# Patient Record
Sex: Female | Born: 1965 | Race: White | Hispanic: No | Marital: Single | Smoking: Never smoker
Health system: Southern US, Community
[De-identification: ages and names within clinical notes are randomized; demographics above are authoritative.]

## PROBLEM LIST (undated history)

## (undated) DIAGNOSIS — N83209 Unspecified ovarian cyst, unspecified side: Secondary | ICD-10-CM

## (undated) DIAGNOSIS — R87629 Unspecified abnormal cytological findings in specimens from vagina: Secondary | ICD-10-CM

## (undated) DIAGNOSIS — Z8774 Personal history of (corrected) congenital malformations of heart and circulatory system: Secondary | ICD-10-CM

## (undated) DIAGNOSIS — O09529 Supervision of elderly multigravida, unspecified trimester: Secondary | ICD-10-CM

## (undated) DIAGNOSIS — R002 Palpitations: Secondary | ICD-10-CM

## (undated) HISTORY — DX: Unspecified ovarian cyst, unspecified side: N83.209

## (undated) HISTORY — DX: Palpitations: R00.2

## (undated) HISTORY — DX: Supervision of elderly multigravida, unspecified trimester: O09.529

## (undated) HISTORY — DX: Unspecified abnormal cytological findings in specimens from vagina: R87.629

## (undated) HISTORY — DX: Personal history of (corrected) congenital malformations of heart and circulatory system: Z87.74

## (undated) HISTORY — PX: COLPOSCOPY: SHX161

---
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
## 2004-03-15 HISTORY — PX: OTHER SURGICAL HISTORY: SHX169

## 2004-07-08 ENCOUNTER — Encounter: Admission: RE | Admit: 2004-07-08 | Discharge: 2004-07-08 | Payer: Self-pay | Admitting: *Deleted

## 2005-07-12 ENCOUNTER — Encounter: Admission: RE | Admit: 2005-07-12 | Discharge: 2005-07-12 | Payer: Self-pay | Admitting: *Deleted

## 2006-03-02 HISTORY — PX: US ECHOCARDIOGRAPHY: HXRAD669

## 2006-09-23 ENCOUNTER — Encounter: Admission: RE | Admit: 2006-09-23 | Discharge: 2006-09-23 | Payer: Self-pay | Admitting: *Deleted

## 2007-12-28 ENCOUNTER — Encounter: Admission: RE | Admit: 2007-12-28 | Discharge: 2007-12-28 | Payer: Self-pay | Admitting: Obstetrics and Gynecology

## 2008-03-15 HISTORY — PX: KNEE ARTHROSCOPY: SUR90

## 2008-10-15 ENCOUNTER — Ambulatory Visit: Payer: Self-pay | Admitting: Gastroenterology

## 2008-10-15 DIAGNOSIS — K644 Residual hemorrhoidal skin tags: Secondary | ICD-10-CM | POA: Insufficient documentation

## 2008-10-21 ENCOUNTER — Telehealth: Payer: Self-pay | Admitting: Gastroenterology

## 2009-02-05 ENCOUNTER — Encounter: Admission: RE | Admit: 2009-02-05 | Discharge: 2009-02-05 | Payer: Self-pay | Admitting: Obstetrics

## 2010-03-03 ENCOUNTER — Encounter
Admission: RE | Admit: 2010-03-03 | Discharge: 2010-03-03 | Payer: Self-pay | Source: Home / Self Care | Attending: Obstetrics | Admitting: Obstetrics

## 2010-04-06 ENCOUNTER — Encounter: Payer: Self-pay | Admitting: Obstetrics and Gynecology

## 2010-10-05 ENCOUNTER — Telehealth: Payer: Self-pay | Admitting: Cardiovascular Disease

## 2010-10-05 NOTE — Telephone Encounter (Signed)
PT HAS SOME QUESTIONS REGARDING HER PREVIOUS DIAGNOSIS SO SHE CAN RESEARCH IT SOME BEFORE HER NEXT VISIT WITH DR Elease Hashimoto. CHART IN BOX.

## 2010-10-05 NOTE — Telephone Encounter (Signed)
Patient called with old echo results. Has upcoming app and wanted to discuss bicuspid aortic valve, she forgot name of valve /questions answered.

## 2010-11-04 ENCOUNTER — Encounter: Payer: Self-pay | Admitting: Cardiovascular Disease

## 2010-11-13 ENCOUNTER — Ambulatory Visit (INDEPENDENT_AMBULATORY_CARE_PROVIDER_SITE_OTHER): Payer: BC Managed Care – PPO | Admitting: *Deleted

## 2010-11-13 DIAGNOSIS — Z8639 Personal history of other endocrine, nutritional and metabolic disease: Secondary | ICD-10-CM

## 2010-11-13 DIAGNOSIS — Z862 Personal history of diseases of the blood and blood-forming organs and certain disorders involving the immune mechanism: Secondary | ICD-10-CM

## 2010-11-13 DIAGNOSIS — I359 Nonrheumatic aortic valve disorder, unspecified: Secondary | ICD-10-CM

## 2010-11-13 DIAGNOSIS — I10 Essential (primary) hypertension: Secondary | ICD-10-CM

## 2010-11-13 LAB — LIPID PANEL
Cholesterol: 159 mg/dL (ref 0–200)
HDL: 90.8 mg/dL (ref >39.00)
LDL Cholesterol: 63 mg/dL (ref 0–99)
Total CHOL/HDL Ratio: 2
Triglycerides: 27 mg/dL (ref 0.0–149.0)
VLDL: 5.4 mg/dL (ref 0.0–40.0)

## 2010-11-13 LAB — BASIC METABOLIC PANEL
BUN: 24 mg/dL — ABNORMAL HIGH (ref 6–23)
Calcium: 9.2 mg/dL (ref 8.4–10.5)
Creatinine, Ser: 0.8 mg/dL (ref 0.4–1.2)
GFR: 77.5 mL/min (ref >60.00)

## 2010-11-13 LAB — HEPATIC FUNCTION PANEL
ALT: 33 U/L (ref 0–35)
Bilirubin, Direct: 0.1 mg/dL (ref 0.0–0.3)
Total Bilirubin: 0.6 mg/dL (ref 0.3–1.2)

## 2010-11-18 ENCOUNTER — Encounter: Payer: Self-pay | Admitting: Cardiovascular Disease

## 2010-11-18 ENCOUNTER — Ambulatory Visit (INDEPENDENT_AMBULATORY_CARE_PROVIDER_SITE_OTHER): Payer: BC Managed Care – PPO | Admitting: Cardiovascular Disease

## 2010-11-18 VITALS — BP 104/76 | HR 55 | Ht 63.0 in | Wt 126.2 lb

## 2010-11-18 DIAGNOSIS — Q231 Congenital insufficiency of aortic valve: Secondary | ICD-10-CM | POA: Insufficient documentation

## 2010-11-18 NOTE — Assessment & Plan Note (Addendum)
Her valve sounds stable.  We will get a repeat echo since it's been 5 years since her previous  Echo.  I was  able to palpate her abdominal aorta but there is no indication that she has a dissection. LC her again in one year for followup visit.

## 2010-11-18 NOTE — Progress Notes (Signed)
Jessica [name] Date of Birth  1966-02-27 [facility] / [facility] [address]     [address] [place], [place]  [address] [phone]  Fax  [phone]  History of Present Illness:  This is a 45 year old female with a history of a bicuspid aortic valve. She's had some intermittent chest pains in the past.  She's had a few palpitations but nothing very serious. She's exercising on regular basis and works out 5 times a week. She's been really trying to cut down on her fat intake and carbohydrate intake.  Current Outpatient Prescriptions on File Prior to Visit  Medication Sig Dispense Refill  . fish oil-omega-3 fatty acids 1000 MG capsule Take 1 g by mouth daily.        . Multiple Vitamin (MULTIVITAMIN) tablet Take by mouth daily. Women Ultra Joints      . Multiple Vitamins-Calcium (VIACTIV MULTI-VITAMIN) CHEW Chew by mouth daily.        . propranolol (INDERAL) 10 MG tablet Take 10 mg by mouth as needed.          Allergies  Allergen Reactions  . Penicillins     Past Medical History  Diagnosis Date  . Chest pain   . Palpitations   . History of bicuspid aortic valve     Past Surgical History  Procedure Date  . US echocardiography 03/02/2006    EF 55-60%    History  Smoking status  . Never Smoker   Smokeless tobacco  . Not on file    History  Alcohol Use No    No family history on file.  Reviw of Systems:  Reviewed in the HPI.  All other systems are negative.  Physical Exam: BP 104/76  Pulse 55  Ht 5\' 3"  (1.6 m)  Wt 126 lb 3.2 oz (57.244 kg)  BMI 22.36 kg/m2 The patient is alert and oriented x 3.  The mood and affect are normal.   Skin: warm and dry.  Color is normal.    HEENT:   the sclera are nonicteric.  The mucous membranes are moist.  The carotids are 2+ without bruits.  There is no thyromegaly.  There is no JVD.    Lungs: clear.  The chest wall is non tender.    Heart: regular rate with a normal S1 and S2.  There is a very  soft murmur. The PMI is not displaced.     Abdomen: good bowel sounds.  There is no guarding or rebound.  There is no hepatosplenomegaly or tenderness.  I was able to palpate her abdominal aorta.  Extremities:  no clubbing, cyanosis, or edema.  The legs are without rashes.  The distal pulses are intact.   Neuro:  Cranial nerves II - XII are intact.  Motor and sensory functions are intact.    The gait is normal.  ECG:  Assessment / Plan:

## 2010-11-19 ENCOUNTER — Encounter: Payer: Self-pay | Admitting: Cardiovascular Disease

## 2010-11-19 ENCOUNTER — Ambulatory Visit (HOSPITAL_COMMUNITY): Payer: BC Managed Care – PPO | Attending: Cardiovascular Disease

## 2010-11-19 DIAGNOSIS — Q231 Congenital insufficiency of aortic valve: Secondary | ICD-10-CM

## 2010-11-19 DIAGNOSIS — R079 Chest pain, unspecified: Secondary | ICD-10-CM | POA: Insufficient documentation

## 2010-11-19 DIAGNOSIS — R002 Palpitations: Secondary | ICD-10-CM | POA: Insufficient documentation

## 2010-11-19 DIAGNOSIS — I079 Rheumatic tricuspid valve disease, unspecified: Secondary | ICD-10-CM | POA: Insufficient documentation

## 2010-11-19 DIAGNOSIS — I080 Rheumatic disorders of both mitral and aortic valves: Secondary | ICD-10-CM | POA: Insufficient documentation

## 2010-11-23 ENCOUNTER — Telehealth: Payer: Self-pay | Admitting: Cardiology

## 2010-11-23 NOTE — Telephone Encounter (Signed)
Message copied by Karle Plumber on Mon Nov 23, 2010  5:13 PM ------      Message from: [place], [place]      Created: Fri Nov 20, 2010  5:12 PM       The aortic valve looks the same.  Will plan on repeating the echo in ~5 years.

## 2010-11-24 ENCOUNTER — Telehealth: Payer: Self-pay | Admitting: Cardiovascular Disease

## 2010-11-24 NOTE — Telephone Encounter (Signed)
No change in echo, repeat in 5 years,Pt verbalized understanding. Alfonso Ramus RN

## 2010-11-24 NOTE — Telephone Encounter (Signed)
Pt returning call to Amy from yesterday. Please return call to discuss.

## 2010-12-09 ENCOUNTER — Telehealth: Payer: Self-pay | Admitting: Cardiovascular Disease

## 2010-12-09 NOTE — Telephone Encounter (Signed)
Received request for med rec info from [facility].  Faxed last OV Note, EKG, Echo, and Labs.  No stress tests were available.  Faxed info today.

## 2010-12-09 NOTE — Telephone Encounter (Signed)
Received confirmation that fax went through.

## 2010-12-10 ENCOUNTER — Telehealth: Payer: Self-pay | Admitting: Cardiovascular Disease

## 2010-12-10 NOTE — Telephone Encounter (Signed)
Echo faxed to [facility] @ [phone]  12/10/10/km

## 2010-12-14 HISTORY — PX: OTHER SURGICAL HISTORY: SHX169

## 2011-02-01 ENCOUNTER — Telehealth: Payer: Self-pay | Admitting: Cardiovascular Disease

## 2011-02-01 NOTE — Telephone Encounter (Signed)
Pt calling re status of fax re an adoption form she needed completed

## 2011-02-01 NOTE — Telephone Encounter (Signed)
Already mailed on 01/21/11 pt informed

## 2011-02-08 ENCOUNTER — Telehealth: Payer: Self-pay | Admitting: Gastroenterology

## 2011-02-08 NOTE — Telephone Encounter (Signed)
Pt saw Dr Christella Hartigan on 10/15/2008 for a painful anal bump w/o bleeding, no constipation altho she sometimes strains for a BM. Pt was given Analpram Cream and instructed to do Sitz baths. She has a family hx of Colon Cancer and was instructed to set up a COLON. Pt called back a week later and stated she didn't think she could do the prep d/t the hemorrhoid. She never called back to schedule the COLON. Today, pt reports no problems and wants to know if she should still have the COLON done. Advised pt she needs the COLON d/t her family hx of COLON CANCER. Dr Christella Hartigan, does pt need an OV or can she be scheduled for DIRECT COLON? Thanks.

## 2011-02-09 ENCOUNTER — Other Ambulatory Visit: Payer: Self-pay | Admitting: Gastroenterology

## 2011-02-09 DIAGNOSIS — Z800 Family history of malignant neoplasm of digestive organs: Secondary | ICD-10-CM

## 2011-02-09 NOTE — Telephone Encounter (Signed)
lmom for pt to call back

## 2011-02-09 NOTE — Telephone Encounter (Signed)
Can have direct colonsocopy

## 2011-02-09 NOTE — Telephone Encounter (Signed)
Pt scheduled for PV on 02/22/11 and DIRECT COLON on 02/26/11.

## 2011-02-12 ENCOUNTER — Telehealth: Payer: Self-pay | Admitting: Gastroenterology

## 2011-02-12 NOTE — Telephone Encounter (Signed)
Pt had question regarding how soon she should be put under anesthesia after having knee surgery in October.  Pt was advised to keep previsit appt and the nurse would take down her history and discuss with her at that time.  Pt agreed

## 2011-02-19 ENCOUNTER — Telehealth: Payer: Self-pay | Admitting: Gastroenterology

## 2011-02-19 NOTE — Telephone Encounter (Signed)
Dr Christella Hartigan the compression stocking should not cause a problem should they?

## 2011-02-19 NOTE — Telephone Encounter (Signed)
Pt aware.

## 2011-02-19 NOTE — Telephone Encounter (Signed)
    That should not be a problem at all.

## 2011-02-22 ENCOUNTER — Ambulatory Visit (AMBULATORY_SURGERY_CENTER): Payer: BC Managed Care – PPO | Admitting: *Deleted

## 2011-02-22 ENCOUNTER — Encounter: Payer: Self-pay | Admitting: Gastroenterology

## 2011-02-22 VITALS — Ht 63.0 in | Wt 129.3 lb

## 2011-02-22 DIAGNOSIS — Z1211 Encounter for screening for malignant neoplasm of colon: Secondary | ICD-10-CM

## 2011-02-22 DIAGNOSIS — Z800 Family history of malignant neoplasm of digestive organs: Secondary | ICD-10-CM

## 2011-02-24 ENCOUNTER — Ambulatory Visit (INDEPENDENT_AMBULATORY_CARE_PROVIDER_SITE_OTHER): Payer: Self-pay | Admitting: *Deleted

## 2011-02-24 DIAGNOSIS — I781 Nevus, non-neoplastic: Secondary | ICD-10-CM

## 2011-02-24 DIAGNOSIS — I83893 Varicose veins of bilateral lower extremities with other complications: Secondary | ICD-10-CM

## 2011-02-24 NOTE — Progress Notes (Signed)
Addended by: Clementeen Hoof on: 02/24/2011 02:47 PM   Modules accepted: Orders

## 2011-02-24 NOTE — Progress Notes (Signed)
X=.3% Sotradecol administered with a 27g butterfly.  Patient received a total of 6cc foam.  Cutaneous Laser:pulsed mode  2.8 watts 30 secs. .3 pulses   Total pulses: 239 Total energy 674  Total time:07  Photos: no  Compression stockings applied: yes  Treated all areas of concern- tiny spiders mainly. A few retics on the backs of her kness. Hit a few of the really tiny spiders to see how she reacted to CL ([place]). Did a few on her back and chest too.

## 2011-02-25 ENCOUNTER — Other Ambulatory Visit: Payer: Self-pay | Admitting: Obstetrics

## 2011-02-25 ENCOUNTER — Encounter: Payer: Self-pay | Admitting: Vascular Surgery

## 2011-02-25 DIAGNOSIS — Z1231 Encounter for screening mammogram for malignant neoplasm of breast: Secondary | ICD-10-CM

## 2011-02-26 ENCOUNTER — Ambulatory Visit (AMBULATORY_SURGERY_CENTER): Payer: BC Managed Care – PPO | Admitting: Gastroenterology

## 2011-02-26 ENCOUNTER — Encounter: Payer: Self-pay | Admitting: Gastroenterology

## 2011-02-26 DIAGNOSIS — Z1211 Encounter for screening for malignant neoplasm of colon: Secondary | ICD-10-CM

## 2011-02-26 DIAGNOSIS — K5730 Diverticulosis of large intestine without perforation or abscess without bleeding: Secondary | ICD-10-CM

## 2011-02-26 DIAGNOSIS — Z800 Family history of malignant neoplasm of digestive organs: Secondary | ICD-10-CM

## 2011-02-26 MED ORDER — SODIUM CHLORIDE 0.9 % IV SOLN: 500.0000 mL | INTRAVENOUS | Status: DC

## 2011-02-26 NOTE — Progress Notes (Signed)
Patient did not experience any of the following events: a burn prior to discharge; a fall within the facility; wrong site/side/patient/procedure/implant event; or a hospital transfer or hospital admission upon discharge from the facility. (G8907) Patient did not have preoperative order for IV antibiotic SSI prophylaxis. (G8918)  

## 2011-02-26 NOTE — Op Note (Signed)
[facility] [address] [place], [place]  [address]  COLONOSCOPY PROCEDURE REPORT  PATIENT:  Jessica, [name]  MR#:  [id number] BIRTHDATE:  Apr 10, 1965, 45 yrs. old  GENDER:  female ENDOSCOPIST:  Rachael Fee, MD PROCEDURE DATE:  02/26/2011 PROCEDURE:  Colonoscopy 29562 ASA CLASS:  Class II INDICATIONS:  family history of colon cancer MEDICATIONS:   Fentanyl 50 mcg IV, These medications were titrated to patient response per physician's verbal order, Versed 7 mg IV  DESCRIPTION OF PROCEDURE:   After the risks benefits and alternatives of the procedure were thoroughly explained, informed consent was obtained.  Digital rectal exam was performed and revealed no rectal masses.   The LB [id number] endoscope was introduced through the anus and advanced to the cecum, which was identified by both the appendix and ileocecal valve, without limitations.  The quality of the prep was good..  The instrument was then slowly withdrawn as the colon was fully examined. <<PROCEDUREIMAGES>> FINDINGS:  Mild diverticulosis was found in the sigmoid to descending colon segments (see image3).  This was otherwise a normal examination of the colon (see image1, image2, and image4). Retroflexed views in the rectum revealed no abnormalities. COMPLICATIONS:  None  ENDOSCOPIC IMPRESSION: 1) Mild diverticulosis in the sigmoid to descending colon segments 2) Otherwise normal examination; no polyps or cancers  RECOMMENDATIONS: 1) You should continue to follow colorectal cancer screening guidelines for "routine risk" patients with a repeat colonoscopy in 10 years. There is no need for FOBT (stool) testing for at least 5 years.  REPEAT EXAM:  10 years  ______________________________ Rachael Fee, MD  n. eSIGNED:   Rachael Fee at 02/26/2011 01:59 PM  Pfenning, Misty Stanley, [phone]

## 2011-02-26 NOTE — Patient Instructions (Signed)
Please follow all discharge instructions given to you by the recovery room nurse. If you have any questions or problems after discharge please call [phone]. You will receive a phone call in the am to see how you are doing and answer any questions you may have. Thank you for choosing [facility] for your health care needs.  

## 2011-02-26 NOTE — Progress Notes (Signed)
Pt. States she has "belly ring" that she can't remove.  Has never removed it.

## 2011-03-01 ENCOUNTER — Telehealth: Payer: Self-pay | Admitting: *Deleted

## 2011-03-01 NOTE — Telephone Encounter (Signed)

## 2011-03-05 ENCOUNTER — Ambulatory Visit
Admission: RE | Admit: 2011-03-05 | Discharge: 2011-03-05 | Disposition: A | Discharge status: Home / Self Care | Payer: BC Managed Care – PPO | Source: Ambulatory Visit | Attending: Obstetrics | Admitting: Obstetrics

## 2011-03-05 DIAGNOSIS — Z1231 Encounter for screening mammogram for malignant neoplasm of breast: Secondary | ICD-10-CM

## 2011-04-07 ENCOUNTER — Ambulatory Visit: Payer: BC Managed Care – PPO | Admitting: *Deleted

## 2011-06-23 ENCOUNTER — Ambulatory Visit: Payer: BC Managed Care – PPO | Admitting: *Deleted

## 2011-09-03 ENCOUNTER — Telehealth: Payer: Self-pay

## 2011-09-03 NOTE — Telephone Encounter (Signed)
Phone call from pt.  C/o a throbbing in left side of (L) leg.  Describes an approx. 3 inch "dark bluish/brown area" that with increased pressure, ie: bending, stretching, notices a "sharp, shooting pain" that comes and goes.  States the vein feels "soft", not hard.  Denies any swelling of left leg, or redness/warmth at site.  States has not been wearing compression stockings since her sclerotherapy.  Advised to wear her compression stockings, elevate leg at intervals, and take ibuprofen per package directions.  Advised will have the vein nurse call her on 6/24.

## 2011-11-08 ENCOUNTER — Telehealth: Payer: Self-pay | Admitting: Cardiovascular Disease

## 2011-11-08 NOTE — Telephone Encounter (Signed)
Pt requesting blood work prior to appt, also wants to do toxoplasmosis blood test, pls call [phone]

## 2011-11-08 NOTE — Telephone Encounter (Signed)
lmtcb

## 2011-11-08 NOTE — Telephone Encounter (Signed)
Informed her to have her OB Gyn write the order due to her pregnancy, pt agreed to plan.

## 2011-12-21 ENCOUNTER — Telehealth: Payer: Self-pay | Admitting: Cardiovascular Disease

## 2011-12-21 DIAGNOSIS — E785 Hyperlipidemia, unspecified: Secondary | ICD-10-CM

## 2011-12-21 NOTE — Telephone Encounter (Signed)
Called pt on mobile, lab date set, orders placed.

## 2011-12-21 NOTE — Telephone Encounter (Signed)
MSG LEFT, I WILL TRY TO CALL BACK IN 15 MINUTES

## 2011-12-21 NOTE — Telephone Encounter (Signed)
LMTCB

## 2011-12-21 NOTE — Telephone Encounter (Signed)
plz return call to patient regarding lab orders for upcoming appnt w/Nahser

## 2011-12-27 ENCOUNTER — Other Ambulatory Visit (INDEPENDENT_AMBULATORY_CARE_PROVIDER_SITE_OTHER): Payer: BC Managed Care – PPO

## 2011-12-27 DIAGNOSIS — I781 Nevus, non-neoplastic: Secondary | ICD-10-CM

## 2011-12-27 DIAGNOSIS — E785 Hyperlipidemia, unspecified: Secondary | ICD-10-CM

## 2011-12-27 LAB — HEPATIC FUNCTION PANEL
ALT: 19 U/L (ref 0–35)
AST: 26 U/L (ref 0–37)
Albumin: 3.6 g/dL (ref 3.5–5.2)
Alkaline Phosphatase: 42 U/L (ref 39–117)
Total Protein: 6.7 g/dL (ref 6.0–8.3)

## 2011-12-27 LAB — BASIC METABOLIC PANEL
BUN: 23 mg/dL (ref 6–23)
Calcium: 8.9 mg/dL (ref 8.4–10.5)
GFR: 86.57 mL/min (ref >60.00)
Glucose, Bld: 78 mg/dL (ref 70–99)
Sodium: 141 mEq/L (ref 135–145)

## 2011-12-27 LAB — LIPID PANEL: Cholesterol: 137 mg/dL (ref 0–200)

## 2011-12-28 ENCOUNTER — Ambulatory Visit (INDEPENDENT_AMBULATORY_CARE_PROVIDER_SITE_OTHER): Payer: BC Managed Care – PPO | Admitting: Cardiovascular Disease

## 2011-12-28 ENCOUNTER — Encounter: Payer: Self-pay | Admitting: Cardiovascular Disease

## 2011-12-28 VITALS — BP 136/80 | HR 56 | Ht 63.0 in | Wt 129.1 lb

## 2011-12-28 DIAGNOSIS — Q231 Congenital insufficiency of aortic valve: Secondary | ICD-10-CM

## 2011-12-28 NOTE — Assessment & Plan Note (Signed)
Jessica [name] seems to be stable. She has a bicuspid aortic valve. She'll has mild aortic stenosis.  She informed me today that she would like to try to become pregnant. At this point her aortic valve is not severely stenotic and I don't think that she would have any complications related to her bicuspid aortic valve. She is 46 years old and that would involve some extra degree of complications that she'll need to talk to her obstetrician.  I will see her again in one year for followup visit.

## 2011-12-28 NOTE — Progress Notes (Signed)
Jessica [name] Date of Birth  Jul 26, 1965 [facility] / [facility] [address]     [address] [place], [place]  [address] [phone]  Fax  [phone]  History of Present Illness:  This is a 46 year old female with a history of a bicuspid aortic valve. She's had some intermittent chest pains in the past.  She's had a few palpitations but nothing very serious. She's exercising on regular basis and works out 5 times a week. She's been really trying to cut down on her fat intake and carbohydrate intake.    Current Outpatient Prescriptions on File Prior to Visit  Medication Sig Dispense Refill  . Multiple Vitamins-Calcium (VIACTIV MULTI-VITAMIN) CHEW Chew by mouth daily.        . Probiotic Product (PROBIOTIC PO) Take by mouth. Digestive Enzyme         Allergies  Allergen Reactions  . Penicillins Hives    Past Medical History  Diagnosis Date  . Chest pain   . Palpitations   . History of bicuspid aortic valve     Past Surgical History  Procedure Date  . US echocardiography 03/02/2006    EF 55-60%  . Knee arthrscopy October 2012    left  . Knee arthroscopy 2010    right  . Strabismus surgery 1972    bothe eyes  . Tonsillectomy and adenoidectomy 1974  . Bilateral foot surgery 2006    History  Smoking status  . Never Smoker   Smokeless tobacco  . Never Used    History  Alcohol Use  . 1.8 oz/week  . 3 Glasses of wine per week    Family History  Problem Relation Age of Onset  . Colon cancer Paternal Uncle     great uncle    Reviw of Systems:  Reviewed in the HPI.  All other systems are negative.  Physical Exam: BP 136/80  Pulse 56  Ht 5\' 3"  (1.6 m)  Wt 129 lb 1.9 oz (58.568 kg)  BMI 22.87 kg/m2 The patient is alert and oriented x 3.  The mood and affect are normal.   Skin: warm and dry.  Color is normal.    HEENT:   the sclera are nonicteric.  The mucous membranes are moist.  The carotids are 2+ without bruits.  There is no  thyromegaly.  There is no JVD.    Lungs: clear.  The chest wall is non tender.    Heart: regular rate with a normal S1 and S2.  There is a very soft murmur. The PMI is not displaced.     Abdomen: good bowel sounds.  There is no guarding or rebound.  There is no hepatosplenomegaly or tenderness.  I was able to palpate her abdominal aorta.  Extremities:  no clubbing, cyanosis, or edema.  The legs are without rashes.  The distal pulses are intact.   Neuro:  Cranial nerves II - XII are intact.  Motor and sensory functions are intact.    The gait is normal.  ECG: 12/28/2011-bradycardia 56 beats a minute. She has no ST or T wave changes. Assessment / Plan:

## 2011-12-28 NOTE — Patient Instructions (Addendum)
I recommend that you work out on a regular basis.  Your physician wants you to follow-up in: 1 year  You will receive a reminder letter in the mail two months in advance. If you don't receive a letter, please call our office to schedule the follow-up appointment.  Your physician recommends that you continue on your current medications as directed. Please refer to the Current Medication list given to you today.

## 2012-01-13 ENCOUNTER — Encounter: Payer: Self-pay | Admitting: Cardiovascular Disease

## 2012-01-26 ENCOUNTER — Ambulatory Visit: Payer: BC Managed Care – PPO | Admitting: *Deleted

## 2012-01-27 ENCOUNTER — Encounter: Payer: Self-pay | Admitting: *Deleted

## 2012-01-28 ENCOUNTER — Ambulatory Visit: Payer: BC Managed Care – PPO | Admitting: *Deleted

## 2012-02-02 ENCOUNTER — Ambulatory Visit: Payer: BC Managed Care – PPO | Admitting: *Deleted

## 2012-03-01 ENCOUNTER — Ambulatory Visit: Payer: BC Managed Care – PPO | Admitting: *Deleted

## 2012-03-22 ENCOUNTER — Other Ambulatory Visit: Payer: Self-pay | Admitting: Obstetrics

## 2012-03-22 DIAGNOSIS — Z1231 Encounter for screening mammogram for malignant neoplasm of breast: Secondary | ICD-10-CM

## 2012-04-03 ENCOUNTER — Ambulatory Visit
Admission: RE | Admit: 2012-04-03 | Discharge: 2012-04-03 | Disposition: A | Discharge status: Home / Self Care | Payer: BC Managed Care – PPO | Source: Ambulatory Visit | Attending: Obstetrics | Admitting: Obstetrics

## 2012-04-03 DIAGNOSIS — Z1231 Encounter for screening mammogram for malignant neoplasm of breast: Secondary | ICD-10-CM

## 2012-11-14 ENCOUNTER — Encounter: Payer: Self-pay | Admitting: *Deleted

## 2012-11-15 ENCOUNTER — Ambulatory Visit (INDEPENDENT_AMBULATORY_CARE_PROVIDER_SITE_OTHER): Payer: Self-pay | Admitting: *Deleted

## 2012-11-15 ENCOUNTER — Encounter: Payer: Self-pay | Admitting: Vascular Surgery

## 2012-11-15 DIAGNOSIS — I781 Nevus, non-neoplastic: Secondary | ICD-10-CM

## 2012-11-15 NOTE — Progress Notes (Signed)
Treated all areas of concern with a combo of sclero and cutaneous laser. She really didn't have many areas. Tolerated well. Will follow prn. .3% sotradecol administered with a 27G butterfly for a total of 5cc. Thigh high compression stockings applied.

## 2013-01-17 ENCOUNTER — Telehealth: Payer: Self-pay | Admitting: Cardiovascular Disease

## 2013-01-17 DIAGNOSIS — Z13220 Encounter for screening for lipoid disorders: Secondary | ICD-10-CM

## 2013-01-17 NOTE — Telephone Encounter (Signed)
New message    Seeing Dr Elease Hashimoto on 03-05-13.  She want to have fasting lab work while she is here.  Will this be ok?

## 2013-01-17 NOTE — Telephone Encounter (Signed)
Labs scheduled, advised patient ok to get

## 2013-02-21 ENCOUNTER — Telehealth: Payer: Self-pay | Admitting: Cardiovascular Disease

## 2013-02-21 NOTE — Telephone Encounter (Signed)
New problem:  Pt states on 12/18 she is having an embryo transplant and pt wants to know if it will cause any complications for her to come to her appt on the 22nd. Pt is asking if anything done at the appt would disrupt the transplant. Pt is requesting a call back.

## 2013-02-21 NOTE — Telephone Encounter (Signed)
Pt was rescheduled to avoid any possible problem with embryo transplant. Pt accepting of plan.

## 2013-02-27 ENCOUNTER — Other Ambulatory Visit: Payer: Self-pay

## 2013-02-27 DIAGNOSIS — Z1231 Encounter for screening mammogram for malignant neoplasm of breast: Secondary | ICD-10-CM

## 2013-03-05 ENCOUNTER — Ambulatory Visit: Payer: BC Managed Care – PPO | Admitting: Cardiovascular Disease

## 2013-03-05 ENCOUNTER — Other Ambulatory Visit: Payer: BC Managed Care – PPO

## 2013-04-03 ENCOUNTER — Encounter: Payer: Self-pay | Admitting: Cardiovascular Disease

## 2013-04-03 ENCOUNTER — Ambulatory Visit (INDEPENDENT_AMBULATORY_CARE_PROVIDER_SITE_OTHER): Payer: BC Managed Care – PPO | Admitting: Cardiovascular Disease

## 2013-04-03 ENCOUNTER — Ambulatory Visit (INDEPENDENT_AMBULATORY_CARE_PROVIDER_SITE_OTHER): Payer: BC Managed Care – PPO | Admitting: *Deleted

## 2013-04-03 VITALS — BP 118/70 | HR 64 | Ht 63.0 in | Wt 131.0 lb

## 2013-04-03 DIAGNOSIS — Q231 Congenital insufficiency of aortic valve: Secondary | ICD-10-CM

## 2013-04-03 DIAGNOSIS — I781 Nevus, non-neoplastic: Secondary | ICD-10-CM

## 2013-04-03 LAB — HEPATIC FUNCTION PANEL
ALT: 17 U/L (ref 0–35)
AST: 24 U/L (ref 0–37)
Albumin: 3.3 g/dL — ABNORMAL LOW (ref 3.5–5.2)
Alkaline Phosphatase: 35 U/L — ABNORMAL LOW (ref 39–117)
BILIRUBIN DIRECT: 0.1 mg/dL (ref 0.0–0.3)
BILIRUBIN TOTAL: 0.5 mg/dL (ref 0.3–1.2)
Total Protein: 6.5 g/dL (ref 6.0–8.3)

## 2013-04-03 LAB — LIPID PANEL
CHOL/HDL RATIO: 2
Cholesterol: 152 mg/dL (ref 0–200)
HDL: 72.4 mg/dL (ref >39.00)
LDL Cholesterol: 64 mg/dL (ref 0–99)
Triglycerides: 79 mg/dL (ref 0.0–149.0)
VLDL: 15.8 mg/dL (ref 0.0–40.0)

## 2013-04-03 LAB — BASIC METABOLIC PANEL
BUN: 17 mg/dL (ref 6–23)
CO2: 24 meq/L (ref 19–32)
Calcium: 8.7 mg/dL (ref 8.4–10.5)
Chloride: 106 mEq/L (ref 96–112)
Creatinine, Ser: 0.9 mg/dL (ref 0.4–1.2)
GFR: 70.84 mL/min (ref >60.00)
GLUCOSE: 77 mg/dL (ref 70–99)
Potassium: 3.8 mEq/L (ref 3.5–5.1)
Sodium: 136 mEq/L (ref 135–145)

## 2013-04-03 NOTE — Patient Instructions (Signed)
Your physician wants you to follow-up in: 1 year  You will receive a reminder letter in the mail two months in advance. If you don't receive a letter, please call our office to schedule the follow-up appointment.  Your physician recommends that you continue on your current medications as directed. Please refer to the Current Medication list given to you today.  

## 2013-04-03 NOTE — Assessment & Plan Note (Signed)
Jessica [name] is doing well.  She has a bicuspid aortic valve with minimal AS and AI.    I would anticipate that she would have no complications during her pregnancy.    I will see her back in 1 year.

## 2013-04-03 NOTE — Progress Notes (Signed)
Jessica [name] Date of Birth  04-09-1965 [facility] [address]     [address] [place], [place]  [address] [phone]  Fax  [phone]   Problem list: 1. Bicuspid aortic valve 2. Palpitations   History of Present Illness:  This is a 48 year old female with a history of a bicuspid aortic valve. She's had some intermittent chest pains in the past.  She's had a few palpitations but nothing very serious. She's exercising on regular basis and works out 5 times a week. She's been really trying to cut down on her fat intake and carbohydrate intake.  Jan. 20, 2015:  Jessica StanleyLisa is doing well. She is pregnant.  [employer] was sold to [employer].  No cardiac problems.   No current outpatient prescriptions on file prior to visit.   No current facility-administered medications on file prior to visit.    Allergies  Allergen Reactions  . Penicillins Hives    Past Medical History  Diagnosis Date  . Chest pain   . Palpitations   . History of bicuspid aortic valve     Past Surgical History  Procedure Laterality Date  . [place] echocardiography  03/02/2006    EF 55-60%  . Knee arthrscopy  October 2012    left  . Knee arthroscopy  2010    right  . Strabismus surgery  1972    bothe eyes  . Tonsillectomy and adenoidectomy  1974  . Bilateral foot surgery  2006    History  Smoking status  . Never Smoker   Smokeless tobacco  . Never Used    History  Alcohol Use  . 1.8 oz/week  . 3 Glasses of wine per week    Family History  Problem Relation Age of Onset  . Colon cancer Paternal Uncle     great uncle    Reviw of Systems:  Reviewed in the HPI.  All other systems are negative.  Physical Exam: BP 118/70  Pulse 64  Ht 5\' 3"  (1.6 m)  Wt 131 lb (59.421 kg)  BMI 23.21 kg/m2 The patient is alert and oriented x 3.  The mood and affect are normal.   Skin: warm and dry.  Color is normal.    HEENT:   the sclera are nonicteric.  The mucous  membranes are moist.  The carotids are 2+ without bruits.  There is no thyromegaly.  There is no JVD.    Lungs: clear.  The chest wall is non tender.    Heart: regular rate with a normal S1 and S2.  There is a very soft murmur. The PMI is not displaced.     Abdomen: good bowel sounds.  There is no guarding or rebound.  There is no hepatosplenomegaly or tenderness.  I was able to palpate her abdominal aorta.  Extremities:  no clubbing, cyanosis, or edema.  The legs are without rashes.  The distal pulses are intact.   Neuro:  Cranial nerves II - XII are intact.  Motor and sensory functions are intact.    The gait is normal.  ECG: Jan. 20, 2015:  NSR at 6864.  Right axis.   Assessment / Plan:

## 2013-04-27 LAB — OB RESULTS CONSOLE GC/CHLAMYDIA
Chlamydia: NEGATIVE
Gonorrhea: NEGATIVE

## 2013-04-27 LAB — OB RESULTS CONSOLE ANTIBODY SCREEN: Antibody Screen: NEGATIVE

## 2013-04-27 LAB — OB RESULTS CONSOLE ABO/RH: RH TYPE: POSITIVE

## 2013-04-27 LAB — OB RESULTS CONSOLE HEPATITIS B SURFACE ANTIGEN: HEP B S AG: NEGATIVE

## 2013-04-27 LAB — OB RESULTS CONSOLE RUBELLA ANTIBODY, IGM: Rubella: IMMUNE

## 2013-04-27 LAB — OB RESULTS CONSOLE RPR: RPR: NONREACTIVE

## 2013-04-27 LAB — OB RESULTS CONSOLE HIV ANTIBODY (ROUTINE TESTING): HIV: NONREACTIVE

## 2013-07-31 ENCOUNTER — Other Ambulatory Visit (HOSPITAL_COMMUNITY): Payer: BC Managed Care – PPO

## 2013-09-24 ENCOUNTER — Other Ambulatory Visit (HOSPITAL_COMMUNITY): Payer: Self-pay | Admitting: Obstetrics

## 2013-09-24 ENCOUNTER — Ambulatory Visit (HOSPITAL_COMMUNITY): Payer: BC Managed Care – PPO | Attending: Cardiology | Admitting: Radiology

## 2013-09-24 DIAGNOSIS — Q231 Congenital insufficiency of aortic valve: Secondary | ICD-10-CM | POA: Insufficient documentation

## 2013-09-24 DIAGNOSIS — R002 Palpitations: Secondary | ICD-10-CM | POA: Insufficient documentation

## 2013-09-24 DIAGNOSIS — R079 Chest pain, unspecified: Secondary | ICD-10-CM | POA: Insufficient documentation

## 2013-09-24 DIAGNOSIS — R011 Cardiac murmur, unspecified: Secondary | ICD-10-CM | POA: Insufficient documentation

## 2013-09-24 DIAGNOSIS — I079 Rheumatic tricuspid valve disease, unspecified: Secondary | ICD-10-CM | POA: Insufficient documentation

## 2013-09-24 DIAGNOSIS — I359 Nonrheumatic aortic valve disorder, unspecified: Secondary | ICD-10-CM

## 2013-09-24 DIAGNOSIS — O99419 Diseases of the circulatory system complicating pregnancy, unspecified trimester: Principal | ICD-10-CM

## 2013-09-24 DIAGNOSIS — I2510 Atherosclerotic heart disease of native coronary artery without angina pectoris: Secondary | ICD-10-CM | POA: Insufficient documentation

## 2013-09-24 NOTE — Progress Notes (Signed)
Echocardiogram performed.  

## 2013-10-16 LAB — OB RESULTS CONSOLE GBS: STREP GROUP B AG: NEGATIVE

## 2013-10-25 ENCOUNTER — Telehealth (HOSPITAL_COMMUNITY): Payer: Self-pay | Admitting: *Deleted

## 2013-10-25 ENCOUNTER — Encounter (HOSPITAL_COMMUNITY): Payer: Self-pay | Admitting: *Deleted

## 2013-10-25 NOTE — Telephone Encounter (Signed)
Preadmission screen  

## 2013-10-26 ENCOUNTER — Inpatient Hospital Stay (HOSPITAL_COMMUNITY)
Admission: RE | Admit: 2013-10-26 | Discharge: 2013-10-31 | DRG: 765 | Disposition: A | Discharge status: Home / Self Care | Payer: BC Managed Care – PPO | Source: Ambulatory Visit | Attending: Obstetrics | Admitting: Obstetrics

## 2013-10-26 ENCOUNTER — Encounter (HOSPITAL_COMMUNITY): Payer: Self-pay

## 2013-10-26 ENCOUNTER — Other Ambulatory Visit: Payer: Self-pay | Admitting: Obstetrics

## 2013-10-26 DIAGNOSIS — R011 Cardiac murmur, unspecified: Secondary | ICD-10-CM | POA: Diagnosis present

## 2013-10-26 DIAGNOSIS — IMO0002 Reserved for concepts with insufficient information to code with codable children: Secondary | ICD-10-CM | POA: Diagnosis present

## 2013-10-26 DIAGNOSIS — D62 Acute posthemorrhagic anemia: Secondary | ICD-10-CM | POA: Diagnosis present

## 2013-10-26 DIAGNOSIS — Q231 Congenital insufficiency of aortic valve: Secondary | ICD-10-CM

## 2013-10-26 DIAGNOSIS — K219 Gastro-esophageal reflux disease without esophagitis: Secondary | ICD-10-CM | POA: Diagnosis present

## 2013-10-26 DIAGNOSIS — Z800 Family history of malignant neoplasm of digestive organs: Secondary | ICD-10-CM | POA: Diagnosis not present

## 2013-10-26 DIAGNOSIS — Z833 Family history of diabetes mellitus: Secondary | ICD-10-CM

## 2013-10-26 DIAGNOSIS — O9903 Anemia complicating the puerperium: Secondary | ICD-10-CM | POA: Diagnosis present

## 2013-10-26 DIAGNOSIS — O09529 Supervision of elderly multigravida, unspecified trimester: Secondary | ICD-10-CM | POA: Diagnosis present

## 2013-10-26 DIAGNOSIS — O365990 Maternal care for other known or suspected poor fetal growth, unspecified trimester, not applicable or unspecified: Secondary | ICD-10-CM | POA: Diagnosis present

## 2013-10-26 DIAGNOSIS — Z8774 Personal history of (corrected) congenital malformations of heart and circulatory system: Secondary | ICD-10-CM

## 2013-10-26 LAB — CBC
HEMATOCRIT: 33.4 % — AB (ref 36.0–46.0)
Hemoglobin: 11 g/dL — ABNORMAL LOW (ref 12.0–15.0)
MCH: 29.7 pg (ref 26.0–34.0)
MCHC: 32.9 g/dL (ref 30.0–36.0)
MCV: 90.3 fL (ref 78.0–100.0)
Platelets: 299 10*3/uL (ref 150–400)
RBC: 3.7 MIL/uL — ABNORMAL LOW (ref 3.87–5.11)
RDW: 13.8 % (ref 11.5–15.5)
WBC: 7 10*3/uL (ref 4.0–10.5)

## 2013-10-26 MED ORDER — LACTATED RINGERS IV SOLN: 500.0000 mL | INTRAVENOUS | Status: DC | PRN

## 2013-10-26 MED ORDER — OXYTOCIN 40 UNITS IN LACTATED RINGERS INFUSION - SIMPLE MED
1.0000 m[IU]/min | INTRAVENOUS | Status: DC
  Administered 2013-10-27: 28 m[IU]/min via INTRAVENOUS
  Administered 2013-10-27: 2 m[IU]/min via INTRAVENOUS
  Filled 2013-10-26: qty 1000

## 2013-10-26 MED ORDER — LIDOCAINE HCL (PF) 1 % IJ SOLN: 30.0000 mL | INTRAMUSCULAR | Status: DC | PRN

## 2013-10-26 MED ORDER — ZOLPIDEM TARTRATE 5 MG PO TABS: 5.0000 mg | ORAL_TABLET | Freq: Every evening | ORAL | Status: DC | PRN

## 2013-10-26 MED ORDER — OXYTOCIN 40 UNITS IN LACTATED RINGERS INFUSION - SIMPLE MED
1.0000 m[IU]/min | INTRAVENOUS | Status: DC
  Administered 2013-10-28: 2 m[IU]/min via INTRAVENOUS
  Filled 2013-10-26: qty 1000

## 2013-10-26 MED ORDER — MISOPROSTOL 25 MCG QUARTER TABLET
25.0000 ug | ORAL_TABLET | ORAL | Status: DC | PRN
  Administered 2013-10-26 – 2013-10-27 (×2): 25 ug via VAGINAL
  Filled 2013-10-26 (×2): qty 0.25

## 2013-10-26 MED ORDER — OXYTOCIN BOLUS FROM INFUSION: 500.0000 mL | INTRAVENOUS | Status: DC

## 2013-10-26 MED ORDER — OXYCODONE-ACETAMINOPHEN 5-325 MG PO TABS: 1.0000 | ORAL_TABLET | ORAL | Status: DC | PRN

## 2013-10-26 MED ORDER — ACETAMINOPHEN 325 MG PO TABS
650.0000 mg | ORAL_TABLET | ORAL | Status: DC | PRN
Start: 2013-10-26 — End: 2013-10-28

## 2013-10-26 MED ORDER — OXYTOCIN 40 UNITS IN LACTATED RINGERS INFUSION - SIMPLE MED: 62.5000 mL/h | INTRAVENOUS | Status: DC

## 2013-10-26 MED ORDER — CITRIC ACID-SODIUM CITRATE 334-500 MG/5ML PO SOLN
30.0000 mL | ORAL | Status: DC | PRN
  Administered 2013-10-28: 30 mL via ORAL
  Filled 2013-10-26: qty 15

## 2013-10-26 MED ORDER — TERBUTALINE SULFATE 1 MG/ML IJ SOLN: 0.2500 mg | Freq: Once | INTRAMUSCULAR | Status: AC | PRN

## 2013-10-26 MED ORDER — IBUPROFEN 600 MG PO TABS
600.0000 mg | ORAL_TABLET | Freq: Four times a day (QID) | ORAL | Status: DC | PRN
Start: 2013-10-26 — End: 2013-10-28

## 2013-10-26 MED ORDER — ONDANSETRON HCL 4 MG/2ML IJ SOLN: 4.0000 mg | Freq: Four times a day (QID) | INTRAMUSCULAR | Status: DC | PRN

## 2013-10-26 MED ORDER — LACTATED RINGERS IV SOLN
INTRAVENOUS | Status: DC
  Administered 2013-10-27 – 2013-10-28 (×5): via INTRAVENOUS

## 2013-10-26 MED ORDER — FLEET ENEMA 7-19 GM/118ML RE ENEM: 1.0000 | ENEMA | RECTAL | Status: DC | PRN

## 2013-10-26 NOTE — H&P (Signed)
Wilmer FloorLisa A Klang is a 48 y.o. G2P0 at 3580w6d presenting for IOL due to growth restriction. Pt notes no contractions\ . Good fetal movement, No vaginal bleeding, not leaking fluid. No HA, no vision change, no RUQ pain.   [facility] 7 wks - donor embyro pregnancy. AMA, age based ovarian decline, single. Donor egg age 48 yo. - AMA. Nl NT, nl informaseq, XX - Bicuspid aortic valve. Nl maternal echo in 3rd trimester, possible bicuspid valve, normal EF. Pt does not receive dental prophylaxis, no artificial valves - resolved low lying placenta - IUGR. Growth monitored due to AMA. 32 wks - EFW 52%, 34 wks: EFW 8%. AFI 16, BPP 8/8 AC 1% w/ <1cm growth in 2 wks. 36 wks: EFW 9%. AFI 10, BPP 8/8, nl dopplers, AC at 3% but adequate growth from prior. Plan for delivery at 37 wks.  - multiple drug allergies: Cipro, PCN, Flagyl  Prenatal Transfer Tool  Maternal Diabetes: No Genetic Screening: Normal Maternal Ultrasounds/Referrals: Normal Fetal Ultrasounds or other Referrals:  None Maternal Substance Abuse:  No Significant Maternal Medications:  None Significant Maternal Lab Results: None     OB History   Grav Para Term Preterm Abortions TAB SAB Ect Mult Living   2              Past Medical History  Diagnosis Date  . Chest pain   . Palpitations   . History of bicuspid aortic valve   . AMA (advanced maternal age) multigravida 35+   . Vaginal Pap smear, abnormal   . Ovarian cyst    Past Surgical History  Procedure Laterality Date  . [place] echocardiography  03/02/2006    EF 55-60%  . Knee arthrscopy  October 2012    left  . Knee arthroscopy  2010    right  . Strabismus surgery  1972    bothe eyes  . Tonsillectomy and adenoidectomy  1974  . Bilateral foot surgery  2006  . Colposcopy     Family History: family history includes Colon cancer in her paternal uncle; Diabetes in her maternal grandmother and mother; Hypertension in her maternal grandfather; Rheumatic fever in her  sister. Social History:  reports that she has never smoked. She has never used smokeless tobacco. She reports that she drinks about 1.8 ounces of alcohol per week. She reports that she does not use illicit drugs.  Review of Systems - Negative except for pregnancy   Dilation: Closed Station: -3 Exam by:: C. Blackstock, RN Blood pressure 118/76, pulse 87, temperature 98.6 F (37 C), temperature source Oral, resp. rate 18, height 5\' 3"  (1.6 m), weight 72.122 kg (159 lb), last menstrual period 02/07/2013.  Physical Exam:  Gen: well appearing, no distress CV: RRR Pulm: CTAB Back: no CVAT Abd: gravid, NT, no RUQ pain LE: 2+ edema, equal bilaterally, non-tender, 2+ DTR Toco: irritability since cytotec placed FH: baseline 140s, accelerations present, no deceleratons, 10 beat variability  Prenatal labs: ABO, Rh: O/Positive/-- (02/13 0000) Antibody: Negative (02/13 0000) Rubella:  immune RPR: Nonreactive (02/13 0000)  HBsAg: Negative (02/13 0000)  HIV: Non-reactive (02/13 0000)  GBS: Negative (08/04 0000)  1 hr Glucola 80  Genetic screening nl INformaseq, XX, nl NT, nl AFP Anatomy US nl  CBC    Component Value Date/Time   WBC 7.0 10/26/2013 2020   RBC 3.70* 10/26/2013 2020   HGB 11.0* 10/26/2013 2020   HCT 33.4* 10/26/2013 2020   PLT 299 10/26/2013 2020   MCV 90.3  10/26/2013 2020   MCH 29.7 10/26/2013 2020   MCHC 32.9 10/26/2013 2020   RDW 13.8 10/26/2013 2020       Assessment/Plan: 48 y.o. G2P0 at [redacted]w[redacted]d IOL due to IUGR with significant drop off growth curve, concern for placental factor given AMA at 48 yo. Recc move to delivery. Ripen cvx o/n, pit/ cervical foley/ AROM in am R/B and increased risk of c/s w/ IOL d/w pt.    Jazz Biddy A. 10/26/2013, 10:47 PM

## 2013-10-27 LAB — RPR

## 2013-10-27 MED ORDER — MISOPROSTOL 25 MCG QUARTER TABLET
25.0000 ug | ORAL_TABLET | Freq: Once | ORAL | Status: AC
  Administered 2013-10-27: 25 ug via ORAL
  Filled 2013-10-27: qty 0.25

## 2013-10-27 MED ORDER — OXYTOCIN 40 UNITS IN LACTATED RINGERS INFUSION - SIMPLE MED: 1.0000 m[IU]/min | INTRAVENOUS | Status: DC

## 2013-10-27 MED ORDER — TERBUTALINE SULFATE 1 MG/ML IJ SOLN: 0.2500 mg | Freq: Once | INTRAMUSCULAR | Status: AC | PRN

## 2013-10-27 NOTE — Progress Notes (Signed)
Dr. Ernestina PennaFogleman attempted to place cervical foley bulb. Unsuccessful. Pt cervix closed.

## 2013-10-27 NOTE — Progress Notes (Signed)
S: Doing well, no complaints, pain well controlled, planning epidural though not uncomfortable  O: BP 104/66  Pulse 69  Temp(Src) 97.8 F (36.6 C) (Oral)  Resp 16  Ht $Rem oveBeforeDEID_XKaPrvYigsIVhSHoBmYTvyYKoavBRmRQ$5\' 3"LMP 02/07/2013   FHT:  FHR: 130s bpm, variability: moderate,  accelerations:  Present,  decelerations:  Absent UC:   Irregular irritibility SVE:   Dilation: Closed Effacement (%): 40 Station: -3 Exam by:: Stepfanie Yott  Attempted to placed foley bulb. Sterile speculum exam in lithotomy position. Betadine to cervix. cvx closed. Tenaculum placed to anterior cervical lip for counter traction. Attempt to pass foley catheter through cervix unsuccessful as no give/ dilation. Several attempts made. Pt tolerated well. Procedure abandoned. Tenaculum removed, Bleeding noted from tenaculum site. Pt counseled.   A / P:  48 y.o.  Obstetric History   G2   P0   T0   P0   A0   TAB0   SAB0   E0   M0   L0    at 5619w0d IOL due to IUGR, latent phase  Fetal Wellbeing:  Category I Pain Control:  Labor support without medications  Anticipated MOD:  NSVD  Deneise Getty A. 10/27/2013, 12:03 PM

## 2013-10-27 NOTE — Progress Notes (Signed)
S: Doing well, no complaints, pain well controlled no epidural at this time. Planning when uncomfortable.   O: BP 108/63  Pulse 71  Temp(Src) 98.1 F (36.7 C) (Oral)  Resp 16  Ht 5\' 3"  (1.6 m)  Wt 72.122 kg (159 lb)  BMI 28.17 kg/m2  LMP 02/07/2013   FHT:  FHR: 145s bpm, variability: moderate,  accelerations:  Present,  decelerations:  Absent UC:   regular, every 2 minutes, pitocin on  SVE:   Dilation: Closed Effacement (%): 40 Station: -3 Exam by:: Jessica [name]   A / P:  48 y.o.  Obstetric History   G2   P0   T0   P0   A0   TAB0   SAB0   E0   M0   L0    at 8947w0d IOL due to IUGR, slow cervical ripening, still unable to pass through external cervical os, not a candidate for repeat attempt at foley catheter at this time. Cont pitocin. If pt does not start feeling contractions in the next 3-4 hrs may rest w/ cytotec overnight.   Fetal Wellbeing:  Category I Pain Control:  Labor support without medications  Anticipated MOD:  NSVD  Jessica [name] A. 10/27/2013, 5:40 PM

## 2013-10-28 ENCOUNTER — Encounter (HOSPITAL_COMMUNITY): Payer: BC Managed Care – PPO | Admitting: Anesthesiology

## 2013-10-28 ENCOUNTER — Inpatient Hospital Stay (HOSPITAL_COMMUNITY): Payer: BC Managed Care – PPO | Admitting: Anesthesiology

## 2013-10-28 ENCOUNTER — Encounter (HOSPITAL_COMMUNITY): Payer: Self-pay

## 2013-10-28 ENCOUNTER — Encounter (HOSPITAL_COMMUNITY)
Admission: RE | Disposition: A | Discharge status: Home / Self Care | Payer: Self-pay | Source: Ambulatory Visit | Attending: Obstetrics

## 2013-10-28 LAB — TYPE AND SCREEN
ABO/RH(D): O POS
Antibody Screen: NEGATIVE

## 2013-10-28 LAB — ABO/RH: ABO/RH(D): O POS

## 2013-10-28 SURGERY — Surgical Case
Anesthesia: Epidural | Site: Back

## 2013-10-28 MED ORDER — SIMETHICONE 80 MG PO CHEW
80.0000 mg | CHEWABLE_TABLET | ORAL | Status: DC | PRN
  Administered 2013-10-30 – 2013-10-31 (×2): 80 mg via ORAL

## 2013-10-28 MED ORDER — SCOPOLAMINE 1 MG/3DAYS TD PT72
MEDICATED_PATCH | TRANSDERMAL | Status: AC
  Administered 2013-10-28: 1.5 mg via TRANSDERMAL
  Filled 2013-10-28: qty 1

## 2013-10-28 MED ORDER — SODIUM CHLORIDE 0.9 % IJ SOLN: 3.0000 mL | INTRAMUSCULAR | Status: DC | PRN

## 2013-10-28 MED ORDER — MEPERIDINE HCL 25 MG/ML IJ SOLN
INTRAMUSCULAR | Status: AC
  Administered 2013-10-28: 6.25 mg via INTRAVENOUS
  Filled 2013-10-28: qty 1

## 2013-10-28 MED ORDER — ONDANSETRON HCL 4 MG PO TABS: 4.0000 mg | ORAL_TABLET | ORAL | Status: DC | PRN

## 2013-10-28 MED ORDER — WITCH HAZEL-GLYCERIN EX PADS: 1.0000 "application " | MEDICATED_PAD | CUTANEOUS | Status: DC | PRN

## 2013-10-28 MED ORDER — NALOXONE HCL 1 MG/ML IJ SOLN: 1.0000 ug/kg/h | INTRAVENOUS | Status: DC | PRN

## 2013-10-28 MED ORDER — LACTATED RINGERS IV SOLN
INTRAVENOUS | Status: DC
  Administered 2013-10-29: 300 mL via INTRAVENOUS
  Administered 2013-10-29: 10:00:00 via INTRAVENOUS

## 2013-10-28 MED ORDER — MORPHINE SULFATE (PF) 0.5 MG/ML IJ SOLN
INTRAMUSCULAR | Status: DC | PRN
  Administered 2013-10-28: 4 mg via EPIDURAL

## 2013-10-28 MED ORDER — NALOXONE HCL 0.4 MG/ML IJ SOLN: 0.4000 mg | INTRAMUSCULAR | Status: DC | PRN

## 2013-10-28 MED ORDER — PHENYLEPHRINE HCL 10 MG/ML IJ SOLN
INTRAMUSCULAR | Status: DC | PRN
  Administered 2013-10-28 (×5): 40 ug via INTRAVENOUS

## 2013-10-28 MED ORDER — DIBUCAINE 1 % RE OINT: 1.0000 "application " | TOPICAL_OINTMENT | RECTAL | Status: DC | PRN

## 2013-10-28 MED ORDER — PRENATAL MULTIVITAMIN CH
1.0000 | ORAL_TABLET | Freq: Every day | ORAL | Status: DC
  Administered 2013-10-29 – 2013-10-30 (×2): 1 via ORAL
  Filled 2013-10-28 (×2): qty 1

## 2013-10-28 MED ORDER — LACTATED RINGERS IV SOLN
INTRAVENOUS | Status: DC | PRN
  Administered 2013-10-28: 17:00:00 via INTRAVENOUS

## 2013-10-28 MED ORDER — ONDANSETRON HCL 4 MG/2ML IJ SOLN
INTRAMUSCULAR | Status: AC
  Filled 2013-10-28: qty 2

## 2013-10-28 MED ORDER — ZOLPIDEM TARTRATE 5 MG PO TABS: 5.0000 mg | ORAL_TABLET | Freq: Every evening | ORAL | Status: DC | PRN

## 2013-10-28 MED ORDER — OXYCODONE-ACETAMINOPHEN 5-325 MG PO TABS: 1.0000 | ORAL_TABLET | ORAL | Status: DC | PRN

## 2013-10-28 MED ORDER — GENTAMICIN SULFATE 40 MG/ML IJ SOLN
Freq: Once | INTRAVENOUS | Status: AC
  Administered 2013-10-28: 100 mL via INTRAVENOUS
  Filled 2013-10-28: qty 7.5

## 2013-10-28 MED ORDER — DIPHENHYDRAMINE HCL 50 MG/ML IJ SOLN: 12.5000 mg | INTRAMUSCULAR | Status: DC | PRN

## 2013-10-28 MED ORDER — CLINDAMYCIN PHOSPHATE 300 MG/2ML IJ SOLN: 900.0000 mg | Freq: Once | INTRAMUSCULAR | Status: DC

## 2013-10-28 MED ORDER — OXYTOCIN 10 UNIT/ML IJ SOLN
40.0000 [IU] | INTRAVENOUS | Status: DC | PRN
  Administered 2013-10-28: 40 [IU] via INTRAVENOUS

## 2013-10-28 MED ORDER — ONDANSETRON HCL 4 MG/2ML IJ SOLN: 4.0000 mg | Freq: Three times a day (TID) | INTRAMUSCULAR | Status: DC | PRN

## 2013-10-28 MED ORDER — OXYTOCIN 40 UNITS IN LACTATED RINGERS INFUSION - SIMPLE MED: 62.5000 mL/h | INTRAVENOUS | Status: AC

## 2013-10-28 MED ORDER — SODIUM BICARBONATE 8.4 % IV SOLN
INTRAVENOUS | Status: DC | PRN
  Administered 2013-10-28: 4 mL via EPIDURAL
  Administered 2013-10-28 (×4): 5 mL via EPIDURAL

## 2013-10-28 MED ORDER — ONDANSETRON HCL 4 MG/2ML IJ SOLN: 4.0000 mg | INTRAMUSCULAR | Status: DC | PRN

## 2013-10-28 MED ORDER — TETANUS-DIPHTH-ACELL PERTUSSIS 5-2.5-18.5 LF-MCG/0.5 IM SUSP: 0.5000 mL | Freq: Once | INTRAMUSCULAR | Status: DC

## 2013-10-28 MED ORDER — SIMETHICONE 80 MG PO CHEW
80.0000 mg | CHEWABLE_TABLET | ORAL | Status: DC
  Administered 2013-10-28 – 2013-10-31 (×3): 80 mg via ORAL
  Filled 2013-10-28 (×4): qty 1

## 2013-10-28 MED ORDER — NALBUPHINE HCL 10 MG/ML IJ SOLN: 5.0000 mg | INTRAMUSCULAR | Status: DC | PRN

## 2013-10-28 MED ORDER — LIDOCAINE-EPINEPHRINE (PF) 2 %-1:200000 IJ SOLN
INTRAMUSCULAR | Status: AC
  Filled 2013-10-28: qty 20

## 2013-10-28 MED ORDER — PHENYLEPHRINE 8 MG IN D5W 100 ML (0.08MG/ML) PREMIX OPTIME
INJECTION | INTRAVENOUS | Status: DC | PRN
  Administered 2013-10-28: 60 ug/min via INTRAVENOUS

## 2013-10-28 MED ORDER — SCOPOLAMINE 1 MG/3DAYS TD PT72
1.0000 | MEDICATED_PATCH | Freq: Once | TRANSDERMAL | Status: DC
  Administered 2013-10-28: 1.5 mg via TRANSDERMAL

## 2013-10-28 MED ORDER — SENNOSIDES-DOCUSATE SODIUM 8.6-50 MG PO TABS
2.0000 | ORAL_TABLET | ORAL | Status: DC
  Administered 2013-10-28 – 2013-10-31 (×3): 2 via ORAL
  Filled 2013-10-28 (×3): qty 2

## 2013-10-28 MED ORDER — MENTHOL 3 MG MT LOZG: 1.0000 | LOZENGE | OROMUCOSAL | Status: DC | PRN

## 2013-10-28 MED ORDER — SIMETHICONE 80 MG PO CHEW
80.0000 mg | CHEWABLE_TABLET | Freq: Three times a day (TID) | ORAL | Status: DC
  Administered 2013-10-29 – 2013-10-30 (×5): 80 mg via ORAL
  Filled 2013-10-28 (×7): qty 1

## 2013-10-28 MED ORDER — FENTANYL CITRATE 0.05 MG/ML IJ SOLN: 25.0000 ug | INTRAMUSCULAR | Status: DC | PRN

## 2013-10-28 MED ORDER — ONDANSETRON HCL 4 MG/2ML IJ SOLN
INTRAMUSCULAR | Status: DC | PRN
  Administered 2013-10-28: 4 mg via INTRAVENOUS

## 2013-10-28 MED ORDER — DIPHENHYDRAMINE HCL 50 MG/ML IJ SOLN: 25.0000 mg | INTRAMUSCULAR | Status: DC | PRN

## 2013-10-28 MED ORDER — LANOLIN HYDROUS EX OINT: 1.0000 "application " | TOPICAL_OINTMENT | CUTANEOUS | Status: DC | PRN

## 2013-10-28 MED ORDER — DIPHENHYDRAMINE HCL 25 MG PO CAPS: 25.0000 mg | ORAL_CAPSULE | ORAL | Status: DC | PRN

## 2013-10-28 MED ORDER — DIPHENHYDRAMINE HCL 25 MG PO CAPS: 25.0000 mg | ORAL_CAPSULE | Freq: Four times a day (QID) | ORAL | Status: DC | PRN

## 2013-10-28 MED ORDER — METOCLOPRAMIDE HCL 5 MG/ML IJ SOLN: 10.0000 mg | Freq: Three times a day (TID) | INTRAMUSCULAR | Status: DC | PRN

## 2013-10-28 MED ORDER — IBUPROFEN 600 MG PO TABS
600.0000 mg | ORAL_TABLET | Freq: Four times a day (QID) | ORAL | Status: DC
  Administered 2013-10-28 – 2013-10-31 (×10): 600 mg via ORAL
  Filled 2013-10-28 (×10): qty 1

## 2013-10-28 MED ORDER — MEPERIDINE HCL 25 MG/ML IJ SOLN
6.2500 mg | INTRAMUSCULAR | Status: DC | PRN
  Administered 2013-10-28: 6.25 mg via INTRAVENOUS

## 2013-10-28 MED ORDER — OXYTOCIN 10 UNIT/ML IJ SOLN
INTRAMUSCULAR | Status: AC
  Filled 2013-10-28: qty 4

## 2013-10-28 MED ORDER — SODIUM BICARBONATE 8.4 % IV SOLN
INTRAVENOUS | Status: AC
  Filled 2013-10-28: qty 50

## 2013-10-28 MED ORDER — MORPHINE SULFATE 0.5 MG/ML IJ SOLN
INTRAMUSCULAR | Status: AC
  Filled 2013-10-28: qty 10

## 2013-10-28 SURGICAL SUPPLY — 39 items
BLADE SURG 10 STRL SS (BLADE) ×6 IMPLANT
CLAMP CORD UMBIL (MISCELLANEOUS) IMPLANT
CLOSURE WOUND 1/2 X4 (GAUZE/BANDAGES/DRESSINGS)
CLOTH BEACON ORANGE TIMEOUT ST (SAFETY) ×3 IMPLANT
CONTAINER PREFILL 10% NBF 15ML (MISCELLANEOUS) IMPLANT
DRAPE LG THREE QUARTER DISP (DRAPES) IMPLANT
DRSG OPSITE POSTOP 4X10 (GAUZE/BANDAGES/DRESSINGS) ×3 IMPLANT
DURAPREP 26ML APPLICATOR (WOUND CARE) ×3 IMPLANT
ELECT REM PT RETURN 9FT ADLT (ELECTROSURGICAL) ×3
ELECTRODE REM PT RTRN 9FT ADLT (ELECTROSURGICAL) ×1 IMPLANT
EXTRACTOR VACUUM KIWI (MISCELLANEOUS) IMPLANT
EXTRACTOR VACUUM M CUP 4 TUBE (SUCTIONS) ×1 IMPLANT
EXTRACTOR VACUUM M CUP 4' TUBE (SUCTIONS) ×1
GLOVE BIO SURGEON STRL SZ 6.5 (GLOVE) ×2 IMPLANT
GLOVE BIO SURGEONS STRL SZ 6.5 (GLOVE) ×1
GLOVE BIOGEL PI IND STRL 7.0 (GLOVE) ×1 IMPLANT
GLOVE BIOGEL PI INDICATOR 7.0 (GLOVE) ×4
GOWN STRL REUS W/TWL LRG LVL3 (GOWN DISPOSABLE) ×6 IMPLANT
KIT ABG SYR 3ML LUER SLIP (SYRINGE) IMPLANT
NDL HYPO 25X5/8 SAFETYGLIDE (NEEDLE) IMPLANT
NEEDLE HYPO 25X5/8 SAFETYGLIDE (NEEDLE) IMPLANT
NS IRRIG 1000ML POUR BTL (IV SOLUTION) ×3 IMPLANT
PACK C SECTION WH (CUSTOM PROCEDURE TRAY) ×3 IMPLANT
PAD OB MATERNITY 4.3X12.25 (PERSONAL CARE ITEMS) ×3 IMPLANT
STAPLER VISISTAT 35W (STAPLE) IMPLANT
STRIP CLOSURE SKIN 1/2X4 (GAUZE/BANDAGES/DRESSINGS) IMPLANT
SUT MON AB 4-0 PS1 27 (SUTURE) IMPLANT
SUT PLAIN 0 NONE (SUTURE) IMPLANT
SUT PLAIN 2 0 (SUTURE) ×3
SUT PLAIN 2 0 XLH (SUTURE) IMPLANT
SUT PLAIN ABS 2-0 CT1 27XMFL (SUTURE) IMPLANT
SUT VIC AB 0 CT1 36 (SUTURE) ×3 IMPLANT
SUT VIC AB 0 CTX 36 (SUTURE) ×6
SUT VIC AB 0 CTX36XBRD ANBCTRL (SUTURE) ×2 IMPLANT
SUT VIC AB 2-0 CT1 27 (SUTURE) ×3
SUT VIC AB 2-0 CT1 TAPERPNT 27 (SUTURE) ×1 IMPLANT
TOWEL OR 17X24 6PK STRL BLUE (TOWEL DISPOSABLE) ×3 IMPLANT
TRAY FOLEY CATH 14FR (SET/KITS/TRAYS/PACK) IMPLANT
WATER STERILE IRR 1000ML POUR (IV SOLUTION) ×3 IMPLANT

## 2013-10-28 NOTE — Progress Notes (Signed)
Spoke with Dr. Malen GauzeFoster made him aware of pts hx of bicuspid aortic valve. He reviewed pts most current echo.

## 2013-10-28 NOTE — Anesthesia Preprocedure Evaluation (Addendum)
Anesthesia Evaluation  Patient identified by MRN, date of birth, ID band Patient awake    Reviewed: Allergy & Precautions, H&P , NPO status , Patient's Chart, lab work & pertinent test results  Airway Mallampati: I TM Distance: >3 FB Neck ROM: Full    Dental no notable dental hx. (+) Teeth Intact   Pulmonary neg pulmonary ROS,  breath sounds clear to auscultation  Pulmonary exam normal       Cardiovascular I+ Valvular Problems/Murmurs AS Rhythm:Regular Rate:Normal + Systolic murmurs    Neuro/Psych negative neurological ROS  negative psych ROS   GI/Hepatic Neg liver ROS, GERD-  Medicated and Controlled,  Endo/Other    Renal/GU negative Renal ROS  negative genitourinary   Musculoskeletal   Abdominal   Peds  Hematology   Anesthesia Other Findings   Reproductive/Obstetrics                           Anesthesia Physical Anesthesia Plan  ASA: II and emergent  Anesthesia Plan: Epidural   Post-op Pain Management:    Induction:   Airway Management Planned: Natural Airway  Additional Equipment:   Intra-op Plan:   Post-operative Plan:   Informed Consent: I have reviewed the patients History and Physical, chart, labs and discussed the procedure including the risks, benefits and alternatives for the proposed anesthesia with the patient or authorized representative who has indicated his/her understanding and acceptance.     Plan Discussed with: Anesthesiologist, CRNA and Surgeon  Anesthesia Plan Comments: (Phenylephrine drip to maintain BP within narrow range. No Toradol.)       Anesthesia Quick Evaluation

## 2013-10-28 NOTE — Anesthesia Postprocedure Evaluation (Signed)
  Anesthesia Post Note  Patient: Jessica [name]  Procedure(s) Performed: Procedure(s) (LRB): CESAREAN SECTION (N/A)  Anesthesia type: Epidural  Patient location: PACU  Post pain: Pain level controlled  Post assessment: Post-op Vital signs reviewed  Last Vitals:  Filed Vitals:   10/28/13 1945  BP: 103/74  Pulse: 87  Temp: 36.9 C  Resp: 16    Post vital signs: Reviewed  Level of consciousness: awake  Complications: No apparent anesthesia complications

## 2013-10-28 NOTE — Anesthesia Procedure Notes (Addendum)
Epidural Patient location during procedure: OB Start time: 10/28/2013 5:56 PM  Staffing Anesthesiologist: Forrest Demuro A. Performed by: anesthesiologist   Preanesthetic Checklist Completed: patient identified, site marked, surgical consent, pre-op evaluation, timeout performed, IV checked, risks and benefits discussed and monitors and equipment checked  Epidural Patient position: sitting Prep: site prepped and draped and DuraPrep Patient monitoring: continuous pulse ox and blood pressure Approach: midline Location: L3-L4 Injection technique: LOR air  Needle:  Needle type: Tuohy  Needle gauge: 17 G Needle length: 9 cm and 9 Needle insertion depth: 5 cm cm Catheter type: closed end flexible Catheter size: 19 Gauge Catheter at skin depth: 10 cm Test dose: negative and Other  Assessment Events: blood not aspirated, injection not painful, no injection resistance, negative IV test and no paresthesia  Additional Notes Patient identified. Risks and benefits discussed including failed block, incomplete  Pain control, post dural puncture headache, nerve damage, paralysis, blood pressure Changes, nausea, vomiting, reactions to medications-both toxic and allergic and post Partum back pain. All questions were answered. Patient expressed understanding and wished to proceed.  Sterile technique was used throughout procedure. Attempt x 2  Epidural site was dressed with sterile barrier dressing. No paresthesias, signs of intravascular injection Or signs of intrathecal spread were encountered.  Patient was more comfortable after the epidural was dosed.  FHR was stable after procedure.

## 2013-10-28 NOTE — Brief Op Note (Signed)
10/26/2013 - 10/28/2013  6:02 PM  PATIENT:  [name] A Hanover  48 y.o. female  PRE-OPERATIVE DIAGNOSIS:  failed induction, IUGR, inability to achieve labor  POST-OPERATIVE DIAGNOSIS:  failed induction,  IUGR, inability to achieve labor   PROCEDURE:  Procedure(s): CESAREAN SECTION (N/A) PCS, LTCS w/ 2 layer closure  SURGEON:  Surgeon(s) and Role:    * Dayrin Stallone A. Ernestina PennaFogleman, MD - Primary  PHYSICIAN ASSISTANT:   ASSISTANTS: Arita Missawson, CNM   ANESTHESIA:   epidural  EBL:  Total I/O In: 2700 [I.V.:2700] Out: 600 [Urine:100; Blood:500]  BLOOD ADMINISTERED:none  DRAINS: Urinary Catheter (Foley)   LOCAL MEDICATIONS USED:  NONE  SPECIMEN:  Source of Specimen:  placenta  DISPOSITION OF SPECIMEN:  N/A  COUNTS:  YES  TOURNIQUET:  * No tourniquets in log *  DICTATION: .Note written in EPIC  PLAN OF CARE: Admit to inpatient   PATIENT DISPOSITION:  PACU - hemodynamically stable.   Delay start of Pharmacological VTE agent (>24hrs) due to surgical blood loss or risk of bleeding: yes

## 2013-10-28 NOTE — Transfer of Care (Signed)
Immediate Anesthesia Transfer of Care Note  Patient: Jessica [name]  Procedure(s) Performed: Procedure(s): CESAREAN SECTION (N/A)  Patient Location: PACU  Anesthesia Type:Epidural  Level of Consciousness: awake and alert   Airway & Oxygen Therapy: Patient Spontanous Breathing  Post-op Assessment: Report given to PACU RN and Post -op Vital signs reviewed and stable  Post vital signs: Reviewed and stable  Complications: No apparent anesthesia complications

## 2013-10-28 NOTE — Progress Notes (Signed)
Late entry- pt seen at 9 am  Notes slept well o/n, no pain/ ctx despite cytotec then pitocin. This am, not really feeling ctx. No bleeding.  Further info about embryo transfer- pt notes "difficult" to dilate cervix  O:  Filed Vitals:   10/28/13 1029  BP: 107/77  Pulse: 68  Temp: 98.5 F (36.9 C)  Resp:    Gen: well appearing, no distress Abd: soft, NT, ND Gu: cvx closed,no cervical dilation, a small 'dimple' at the external os. Softening, posterior, vtx  Toco: pit at 20 munits/ min, q 2 min Fh: 150's, + accels, no decels, 10 beat var  A/P: IUGR- IOL at 37'1 due to concern for placental insufficiency given maternal age and sharp declinein growth percentage - FWB Reactive tracing, tolerating ctx well, nl AFI and BPP on Friday - IOL. Very slow progress, no cervical change. Unable to place foley catheter.Cont pitocin today, May consider PCS if unable to enter active labor.R/Breviewed w/ pt.   Ianmichael Amescua A. 10/28/2013 10:50 AM

## 2013-10-28 NOTE — Progress Notes (Signed)
S: Doing well, no complaints, pain well controlled, not feeling any contractions.   O: BP 131/87  Pulse 78  Temp(Src) 98.5 F (36.9 C) (Oral)  Resp 16  Ht 5\' 3"  (1.6 m)  Wt 72.122 kg (159 lb)  BMI 28.17 kg/m2  LMP 02/07/2013   FHT:  FHR: 140s bpm, variability: moderate,  accelerations:  Present,  decelerations:  Absent UC:   regular, every 3 minutes SVE:   Dilation: Closed Effacement (%): 40 Station: -3 Exam by:: [name]   A / P:  48 y.o.  Obstetric History   G2   P0   T0   P0   A0   TAB0   SAB0   E0   M0   L0    at 85100w1d IOL due to IUGR w/ concern for placental insufficiency. Inability to acheive active labor, likely secondary to significant cervical stenosis. d/w pt options of continuing pitocin, resting again w/ cytotec overnight versus PCS. R/B of each d/w pt. Plan PCS.  Fetal Wellbeing:  Category I Pain Control:  spinal vs epidural for c/s  Anticipated MOD:  PCS  Jessica Jasmin A. 10/28/2013, 4:31 PM

## 2013-10-28 NOTE — Op Note (Signed)
10/26/2013 - 10/28/2013  6:02 PM  PATIENT:  Jessica [name]  48 y.o. female  PRE-OPERATIVE DIAGNOSIS:  failed induction, IUGR, inability to achieve labor  POST-OPERATIVE DIAGNOSIS:  failed induction,  IUGR, inability to achieve labor   PROCEDURE:  Procedure(s): CESAREAN SECTION (N/A) PCS, LTCS w/ 2 layer closure  SURGEON:  Surgeon(s) and Role:    * Rupinder Livingston A. Ernestina Penna, MD - Primary  PHYSICIAN ASSISTANT:   ASSISTANTS: Arita Miss, CNM   ANESTHESIA:   epidural  EBL:  Total I/O In: 2700 [I.V.:2700] Out: 600 [Urine:100; Blood:500]  BLOOD ADMINISTERED:none  DRAINS: Urinary Catheter (Foley)   LOCAL MEDICATIONS USED:  NONE  SPECIMEN:  Source of Specimen:  placenta  DISPOSITION OF SPECIMEN:  N/A  COUNTS:  YES  TOURNIQUET:  * No tourniquets in log *  DICTATION: .Note written in EPIC  PLAN OF CARE: Admit to inpatient   PATIENT DISPOSITION:  PACU - hemodynamically stable.   Delay start of Pharmacological VTE agent (>24hrs) due to surgical blood loss or risk of bleeding: yes   Findings:  @BABYSEXEBC @ infant,  APGAR (1 MIN): 8   APGAR (5 MINS): 8   APGAR (10 MINS):   Normal uterus, tubes and ovaries, thick lower uterine segment, normal placenta. 3VC, clear amniotic fluid  EBL: 600 cc Antibiotics:  Clinda/ Gent Complications: none  Indications: This is a 48 y.o. year-old, G1 at [redacted]w[redacted]d admitted for IOL due to IUGR. On the third day of induction, w/o any change to cervix and no painful contractions despite cytotec and pitocin, decision made for PCS. Risks benefits and alternatives of the procedure were discussed with the patient who agreed to proceed  Procedure:  After informed consent was obtained the patient was taken to the operating room where epidural anesthesia was initiated.  She was prepped and draped in the normal sterile fashion in dorsal supine position with a leftward tilt.  A foley catheter was in place.  A Pfannenstiel skin incision was made 2 cm above the pubic  symphysis in the midline with the scalpel.  Dissection was carried down with the Bovie cautery until the fascia was reached. The fascia was incised in the midline. The incision was extended laterally with the Mayo scissors. The inferior aspect of the fascial incision was grasped with the Coker clamps, elevated up and the underlying rectus muscles were dissected off sharply. The superior aspect of the fascial incision was grasped with the Coker clamps elevated up and the underlying rectus muscles were dissected off sharply.  The peritoneum was entered bluntly. The peritoneal incision was extended superiorly and inferiorly with good visualization of the bladder. The bladder blade was inserted and palpation was done to assess the fetal position and the location of the uterine vessels. Bladder flap was created shaply. The lower segment of the uterus was incised sharply with the scalpel. A very thick lower segment was noted and brisk bleeding encountered from an anterior low lying placenta. To improve visualization, Allis clamps were needed on the incision in order to control bleeding and elevated the incision away from the fetal head in order to continue making the incision. Eventually the incision was complete through to the amniotic sac. The incision was extended bluntly. The infants occiput was grasped, brought to the incision,  rotated but we could not affect delivery of the head due to the small uterine incision. Given the brisk bleeding and the limited exposure to further extend the incision, a vacuum was placed on the occiput which was gently delivered. Vacuum  removed, nuchal x 1 reduced and the and the infant was delivered with fundal pressure. The nose and mouth were bulb suctioned. The cord was clamped and cut. The infant was handed off to the waiting pediatrician. The placenta was expressed. The uterus was not able to be exteriorized and thus left in situ.  The uterus was cleared of all clots and debris. The  uterine incision was repaired with 0 Vicryl in a running locked fashion.  A second layer of the same suture was used in an imbricating fashion to obtain excellent hemostasis. The gutters were cleared of all clots and debris. Tubes and ovaries were nspected and found to be normal. The uterine incision was reinspected and found to be hemostatic. The peritoneum was grasped and closed with 2-0 Vicryl in a running fashion. The cut muscle edges and the underside of the fascia were inspected and found to be hemostatic. The fascia was closed with 0 Vicryl in a single layer. The subcutaneous tissue was irrigated. Scarpa's layer was closed with a 2-0 plain gut suture. The skin was closed with a 4-0 Monocryl in a single layer. The patient tolerated the procedure well. Sponge lap and needle counts were correct x3 and patient was taken to the recovery room in a stable condition.  Meaghen Vecchiarelli A. 10/28/2013 6:03 PM

## 2013-10-29 ENCOUNTER — Encounter (HOSPITAL_COMMUNITY): Payer: Self-pay | Admitting: Obstetrics

## 2013-10-29 LAB — CBC
HCT: 29.4 % — ABNORMAL LOW (ref 36.0–46.0)
Hemoglobin: 9.7 g/dL — ABNORMAL LOW (ref 12.0–15.0)
MCH: 29.5 pg (ref 26.0–34.0)
MCHC: 33 g/dL (ref 30.0–36.0)
MCV: 89.4 fL (ref 78.0–100.0)
PLATELETS: 247 10*3/uL (ref 150–400)
RBC: 3.29 MIL/uL — ABNORMAL LOW (ref 3.87–5.11)
RDW: 13.8 % (ref 11.5–15.5)
WBC: 8.1 10*3/uL (ref 4.0–10.5)

## 2013-10-29 NOTE — Lactation Note (Signed)
This note was copied from the chart of Jessica Jessica [name]. Lactation Consultation Note: Follow up visit with mom. She reports that baby tried to nurse about 11 but she was too sleepy and did not nurse. Undressed baby and she woke up. Assisted mom in football hold. Baby rooting and took a few sucks. Assisted mom with finger feeding with a curved tip syringe. Magda Paganiniudrey took 7 cc's and off to sleep. Mom pumping when I left room. Encouraged to feed baby q 3 hours since she is small. Late preterm guidelines given to mom.To supplement until baby is nursing better. Can use EBM if available. No questions at present. BF brochure given to mom with resources for support after DC.To call for assist prn  Patient Name: Jessica [name]: 10/29/2013 Reason for consult: Follow-up assessment;Infant < 6lbs   Maternal Data Formula Feeding for Exclusion: No Does the patient have breastfeeding experience prior to this delivery?: No  Feeding Feeding Type: Breast Fed  LATCH Score/Interventions Latch: Repeated attempts needed to sustain latch, nipple held in mouth throughout feeding, stimulation needed to elicit sucking reflex.  Audible Swallowing: None  Type of Nipple: Everted at rest and after stimulation  Comfort (Breast/Nipple): Soft / non-tender     Hold (Positioning): Assistance needed to correctly position infant at breast and maintain latch. Intervention(s): Breastfeeding basics reviewed;Support Pillows;Skin to skin  LATCH Score: 6  Lactation Tools Discussed/Used WIC Program: No Pump Review: Setup, frequency, and cleaning Initiated by:: RN [name] initiated:: 10/29/13   Consult Status Consult Status: Follow-up [name]: 10/30/13 Follow-up type: In-patient    Pamelia HoitWeeks, Rashawn Rayman D 10/29/2013, 2:28 PM

## 2013-10-29 NOTE — Lactation Note (Signed)
This note was copied from the chart of Girl [name] Mahnken. Lactation Consultation Note  Patient Name: Girl Colen DarlingLisa Zufall [id number]'[id number]'s Date: 10/29/2013 Reason for consult: Follow-up assessment;Infant < 6lbs.  LC asked to review supplement guidelines for this baby weighing under 6 pounds and being borderline premature.  Mom is pumping with DEBP but obtaining only drops at this time.  LC reviewed supplement guidelines (mom has [facility] parent handout) and encouraged continued regular pumping and hand expression which will eventually obtain more milk as colostrum transitions to more mature/flowing milk.   Maternal Data    Feeding Feeding Type: Breast Fed Length of feed: 15 min  LATCH Score/Interventions            Most recent LATCH score = 6 per LC assessment today          Lactation Tools Discussed/Used   Supplementing guidelines Regular DEBP and hand expression use for stimulation of mom's milk supply  Consult Status Consult Status: Follow-up Date: 10/30/13 Follow-up type: In-patient    Warrick ParisianBryant, Mauro Arps [facility] 10/29/2013, 9:22 PM

## 2013-10-29 NOTE — Progress Notes (Signed)
POSTOPERATIVE DAY # 1 S/P CS   S:         Reports feeling tired             Tolerating po intake / no nausea / no vomiting / no flatus / no BM             Bleeding is moderate             Pain controlled with long actnig narcotic             Not OOB yet  Newborn breast feeding    O:  VS: BP 101/61  Pulse 72  Temp(Src) 98.4 F (36.9 C) (Oral)  Resp 18  Ht 5\' 3"  (1.6 m)  Wt 72.122 kg (159 lb)  BMI 28.17 kg/m2  SpO2 97%  LMP 02/07/2013  Breastfeeding? Unknown   LABS:               Recent Labs  10/26/13 2020 10/29/13 0545  WBC 7.0 8.1  HGB 11.0* 9.7*  PLT 299 247               Bloodtype: --/--/O POS, O POS (08/14 2020)  Rubella: Immune (02/13 0000)                                             I&O: Intake/Output     08/16 0701 - 08/17 0700 08/17 0701 - 08/18 0700   I.V. (mL/kg) 2904 (40.3)    Total Intake(mL/kg) 2904 (40.3)    Urine (mL/kg/hr) 375 (0.2)    Blood 500 (0.3)    Total Output 875     Net +2029                       Physical Exam:             Alert and Oriented X3  Lungs: Clear and unlabored  Heart: regular rate and rhythm / no mumurs  Abdomen: soft, non-tender, mildly distended, hypoactive BS             Fundus: firm, non-tender, Ueven             Dressing intact              Incision:  approximated with sutures / no erythema / no ecchymosis / no drainage  Perineum: no edema             Foley draining dark yellow urine  Lochia: moderate  Extremities: trace edema, no calf pain or tenderness, SCD in place  A:        POD # 1 S/P CS             P:        Routine postoperative care              Advance postop care today - anticipatory guidance reviewed for post-op care    Marlinda MikeBAILEY, Early Steel CNM, MSN, [facility]ACNM 10/29/2013, 8:31 AM

## 2013-10-29 NOTE — Lactation Note (Signed)
This note was copied from the chart of Jessica [name]. Lactation Consultation Note;Called to assist mom with latch. She reports that baby was spitting up and acting like she was hungry. Last fed about 2 hours ago- had formula. Baby gets to breast and goes to sleep. Skin to skin with mom when I left room.  Patient Name: Jessica [name] [id number]'s Date: 10/29/2013 Reason for consult: Initial assessment;Infant < 6lbs;Other (Comment) (IUGR, Mom 49 years old- egg and sperm donor)   Maternal Data Formula Feeding for Exclusion: No Does the patient have breastfeeding experience prior to this delivery?: No  Feeding Feeding Type: Breast Fed  LATCH Score/Interventions Latch: Too sleepy or reluctant, no latch achieved, no sucking elicited.  Audible Swallowing: None  Type of Nipple: Flat  Comfort (Breast/Nipple): Soft / non-tender     Hold (Positioning): Assistance needed to correctly position infant at breast and maintain latch. Intervention(s): Skin to skin  LATCH Score: 4  Lactation Tools Discussed/Used Initiated by:: RN Date initiated:: 10/28/13   Consult Status Consult Status: Follow-up Date: 10/29/13 Follow-up type: In-patient    Pamelia HoitWeeks, Yazaira Speas D 10/29/2013, 8:40 AM

## 2013-10-29 NOTE — Anesthesia Postprocedure Evaluation (Signed)
  Anesthesia Post-op Note  Patient: [name] A Rayle  Procedure(s) Performed: Procedure(s): CESAREAN SECTION (N/A)  Patient Location: PACU and Mother/Baby  Anesthesia Type:Epidural  Level of Consciousness: awake, alert , oriented and patient cooperative  Airway and Oxygen Therapy: Patient Spontanous Breathing  Post-op Pain: mild  Post-op Assessment: Post-op Vital signs reviewed, Patient's Cardiovascular Status Stable, Respiratory Function Stable, Patent Airway, No signs of Nausea or vomiting, Adequate PO intake, Pain level controlled, No headache, No backache, No residual numbness and No residual motor weakness  Post-op Vital Signs: Reviewed and stable  Last Vitals:  Filed Vitals:   10/29/13 0605  BP: 101/61  Pulse: 72  Temp:   Resp: 18    Complications: No apparent anesthesia complications

## 2013-10-29 NOTE — Addendum Note (Signed)
Addendum created 10/29/13 0755 by Suella Groveoderick C Shondale Quinley, CRNA   Modules edited: Notes Section   Notes Section:  File: [id number]

## 2013-10-30 MED ORDER — POLYSACCHARIDE IRON COMPLEX 150 MG PO CAPS
150.0000 mg | ORAL_CAPSULE | Freq: Two times a day (BID) | ORAL | Status: DC
  Administered 2013-10-30 – 2013-10-31 (×3): 150 mg via ORAL
  Filled 2013-10-30 (×3): qty 1

## 2013-10-30 MED ORDER — MAGNESIUM OXIDE 400 (241.3 MG) MG PO TABS
200.0000 mg | ORAL_TABLET | Freq: Every day | ORAL | Status: DC
  Administered 2013-10-30 – 2013-10-31 (×2): 200 mg via ORAL
  Filled 2013-10-30 (×2): qty 0.5

## 2013-10-30 NOTE — Progress Notes (Signed)
POD # 2  Subjective: Pt reports feeling well/ Pain controlled with Motrin Tolerating po/Voiding without problems/ No n/v/ Flatus present Activity: ad lib Bleeding is light Newborn info:  Information for the patient's newborn:  Noone, Girl [name] [[id number]][[id number]]  female Feeding: breast   Objective: VS:  Filed Vitals:   10/29/13 1000 10/29/13 1545 10/29/13 1801 10/30/13 0650  BP: 100/61 104/65 103/60 101/58  Pulse: 70 72 80 52  Temp: 99.1 F (37.3 C) 99.1 F (37.3 C) 98.6 F (37 C) 98 F (36.7 C)  TempSrc: Oral Oral Oral   Resp: 18 20 20 20   Height:      Weight:      SpO2: 98% 98% 98%     I&O: Intake/Output     08/17 0701 - 08/18 0700 08/18 0701 - 08/19 0700   P.O. 1005    I.V. (mL/kg) 1445.8 (20)    Total Intake(mL/kg) 2450.8 (34)    Urine (mL/kg/hr) 2650 (1.5)    Blood     Total Output 2650     Net -199.2            LABS:  Recent Labs  10/29/13 0545  WBC 8.1  HGB 9.7*  HCT 29.4*  PLT 247    Blood type: --/--/O POS, O POS (08/14 2020) Rubella: Immune (02/13 0000)     Physical Exam:  General: alert and cooperative CV: Regular rate and rhythm Resp: CTA bilaterally Abdomen: soft, nontender, normal bowel sounds Uterine Fundus: firm, below umbilicus, nontender Incision: Covered with Tegaderm and honeycomb dressing; no significant drainage, edema, bruising, or erythema; well approximated with suture. Lochia: minimal Ext: edema 2+ BLE and Homans sign is negative, no sign of DVT    Assessment/: POD # 2/ G2P1001/ S/P C/Section d/t failed IOL ABL anemia Doing well  Plan: Continue routine post op orders Anticipate discharge home tomorrow   Signed: Lawernce PittsBHAMBRI, Lulubelle Simcoe, N, MSN, CNM 10/30/2013, 12:32 PM

## 2013-10-30 NOTE — Lactation Note (Signed)
This note was copied from the chart of Jessica [name]. Lactation Consultation Note Reviewed chart and attempted visit, but mom is asleep.  Baby has had 8 feedings in the past 24 hours with 5 voids and only 1 stool >12 hours ago and previous to that one > 36 hours.  [facility] to encourage increased supplementation according to guidelines for appropriate age.  Last LATCH score 8. LC to follow prior to discharge and as needed.   Patient Name: Jessica [name]     Maternal Data    Feeding Feeding Type: Breast Milk with Formula added Length of feed: 30 min  LATCH Score/Interventions Latch: Grasps breast easily, tongue down, lips flanged, rhythmical sucking. Intervention(s): Skin to skin;Teach feeding cues;Waking techniques Intervention(s): Assist with latch  Audible Swallowing: A few with stimulation Intervention(s): Skin to skin;Hand expression  Type of Nipple: Everted at rest and after stimulation  Comfort (Breast/Nipple): Soft / non-tender     Hold (Positioning): Assistance needed to correctly position infant at breast and maintain latch. Intervention(s): Breastfeeding basics reviewed;Support Pillows;Position options;Skin to skin  LATCH Score: 8  Lactation Tools Discussed/Used     Consult Status      Jessica [name], Jessica [name] [name], 10:26 PM

## 2013-10-31 MED ORDER — IBUPROFEN 600 MG PO TABS: 600.0000 mg | ORAL_TABLET | Freq: Four times a day (QID) | ORAL | Status: DC

## 2013-10-31 MED ORDER — POLYSACCHARIDE IRON COMPLEX 150 MG PO CAPS: 150.0000 mg | ORAL_CAPSULE | Freq: Two times a day (BID) | ORAL | Status: AC

## 2013-10-31 MED ORDER — OXYCODONE-ACETAMINOPHEN 5-325 MG PO TABS: 1.0000 | ORAL_TABLET | ORAL | Status: DC | PRN

## 2013-10-31 MED ORDER — MAGNESIUM OXIDE 400 (241.3 MG) MG PO TABS: 200.0000 mg | ORAL_TABLET | Freq: Every day | ORAL | Status: AC

## 2013-10-31 MED ORDER — HYDROCHLOROTHIAZIDE 12.5 MG PO TABS: 12.5000 mg | ORAL_TABLET | Freq: Every day | ORAL | Status: AC

## 2013-10-31 NOTE — Discharge Summary (Signed)
POSTOPERATIVE DISCHARGE SUMMARY:  Patient ID: Jessica [name] MRN: [id number] DOB/AGE: 1966/01/31 48 y.o.  Admit date: 10/26/2013 Admission Diagnoses: IOL for IUGR   Discharge date:  10/31/2013 Discharge Diagnoses: S/P Primary C/S due to Failed IOL on 10/28/2013        Prenatal history: G2P1001   EDC : 11/17/2013, by Ultrasound [facility] since 10.[redacted] wks gestation. Primary provider : Dr. Ernestina PennaFogleman Prenatal course complicated by AMA, IVF (donor egg and sperm), bicuspid aortic valve, IUGR  Prenatal Labs: ABO, Rh: O POS (08/14 2020)  Antibody: NEG (08/14 2020) Rubella: Immune (02/13 0000)  RPR: NON REAC (08/14 2020)  HBsAg: Negative (02/13 0000)  HIV: Non-reactive (02/13 0000)  GTT : 80 mg/dL GBS: Negative (40/9808/04 11910000)   Medical / Surgical History :  Past medical history:  Past Medical History  Diagnosis Date  . Chest pain   . Palpitations   . History of bicuspid aortic valve   . AMA (advanced maternal age) multigravida 35+   . Vaginal Pap smear, abnormal   . Ovarian cyst   . Failed induction of labor 10/28/2013  . Postpartum care following cesarean delivery (8/16) 10/28/2013    Past surgical history:  Past Surgical History  Procedure Laterality Date  . [place] echocardiography  03/02/2006    EF 55-60%  . Knee arthrscopy  October 2012    left  . Knee arthroscopy  2010    right  . Strabismus surgery  1972    bothe eyes  . Tonsillectomy and adenoidectomy  1974  . Bilateral foot surgery  2006  . Colposcopy    . Cesarean section N/A 10/28/2013    Procedure: CESAREAN SECTION;  Surgeon: Tresa EndoKelly A. Ernestina PennaFogleman, MD;  Location: [facility];  Service: Obstetrics;  Laterality: N/A;     Allergies: Aspirin; Ciprofloxacin hcl; Flagyl; Penicillins; Phenergan; and Prometrium   Intrapartum Course:  Admited for IOL for IUGR / Cytotec and Pitocin induction with no cervical change x 2+ days / Primary C/S for failed IOL of viable female by Dr. Ernestina PennaFogleman  / no immediate postpartum complications  Physical Exam:   VSS: Blood pressure 114/76, pulse 85, temperature 98.6 F (37 C), temperature source Oral, resp. rate 18, height 5\' 3"  (1.6 m), weight 72.122 kg (159 lb), last menstrual period 02/07/2013, SpO2 98.00%, unknown if currently breastfeeding.  LABS:  Recent Labs  10/29/13 0545  WBC 8.1  HGB 9.7*  PLT 247    Newborn Data Live born female on 10/28/2013 Birth Weight: 4 lb 14.3 oz (2220 g) APGAR: 8, 8  See operative report for further details  Home with mother.  Discharge Instructions:  Wound Care: keep clean and dry / remove honeycomb POD 6 Postpartum Instructions: [facility] discharge booklet - instructions reviewed Medications:    Medication List         hydrochlorothiazide 12.5 MG tablet  Commonly known as:  HYDRODIURIL  Take 1 tablet (12.5 mg total) by mouth daily.     ibuprofen 600 MG tablet  Commonly known as:  ADVIL,MOTRIN  Take 1 tablet (600 mg total) by mouth every 6 (six) hours.     iron polysaccharides 150 MG capsule  Commonly known as:  NIFEREX  Take 1 capsule (150 mg total) by mouth 2 (two) times daily.     magnesium oxide 400 (241.3 MG) MG tablet  Commonly known as:  MAG-OX  Take 0.5 tablets (200 mg total) by mouth daily.     oxyCODONE-acetaminophen 5-325 MG per  tablet  Commonly known as:  PERCOCET/ROXICET  Take 1-2 tablets by mouth every 4 (four) hours as needed for severe pain (moderate - severe pain).     prenatal multivitamin Tabs tablet  Take 1 tablet by mouth daily.     ranitidine 150 MG tablet  Commonly known as:  ZANTAC  Take 150 mg by mouth daily as needed for heartburn.           Follow-up Information   Follow up with [facility] MD. Schedule an appointment as soon as possible for a visit in 6 weeks. (Postpartum visit)    Specialty:  Obstetrics and Gynecology   Contact information:   [address] [place] [address] [phone]       Signed: Kenard Gower, MSN, CNM 10/31/2013, 8:36 AM

## 2013-10-31 NOTE — Progress Notes (Signed)
POD # 3  Subjective: Pt reports feeling well/ Pain controlled with Motrin and occasional Percocet Tolerating po/Voiding without problems/ No n/v/ Flatus present / (+) BM Activity: ad lib Bleeding is light  Newborn info:  Information for the patient's newborn:  Jessica [name], Girl Jessica [name] [[id number]][[id number]]  female  Feeding: breast and formula supplementation   Objective: VS:  Filed Vitals:   10/30/13 0650 10/30/13 1700 10/30/13 1753 10/31/13 0500  BP: 101/58 86/68 137/83 114/76  Pulse: 52 77 75 85  Temp: 98 F (36.7 C)   98.6 F (37 C)  TempSrc:      Resp: 20 20  18   Height:      Weight:      SpO2:    98%    I&O: Intake/Output     08/18 0701 - 08/19 0700 08/19 0701 - 08/20 0700   P.O.     I.V. (mL/kg)     Total Intake(mL/kg)     Urine (mL/kg/hr)     Total Output       Net              LABS:   Recent Labs  10/29/13 0545  WBC 8.1  HGB 9.7*  HCT 29.4*  PLT 247    Blood type: O POS, O POS (08/14 2020) Rubella: Immune (02/13 0000)     Physical Exam:  General: alert and cooperative CV: Regular rate and rhythm Resp: CTA bilaterally Abdomen: soft, nontender, normal bowel sounds Uterine Fundus: firm, below umbilicus, nontender Incision: Covered with Tegaderm and honeycomb dressing; no significant drainage, edema, bruising, or erythema; well approximated with suture. Lochia: minimal Ext: edema 2+ BLE and Homans sign is negative, no sign of DVT    Assessment/: POD # 3 / G2P1001/ S/P C/Section d/t failed IOL ABL anemia Doing well  Plan: Continue routine post op orders D/C Home today   Signed: Kenard GowerAWSON, Janyiah Silveri, M, MSN, CNM 10/31/2013, 8:09 AM

## 2013-10-31 NOTE — Discharge Instructions (Signed)
Breast Pumping Tips °If you are breastfeeding, there may be times when you cannot feed your baby directly. Returning to work or going on a trip are common examples. Pumping allows you to store breast milk and feed it to your baby later.  °You may not get much milk when you first start to pump. Your breasts should start to make more after a few days. If you pump at the times you usually feed your baby, you may be able to keep making enough milk to feed your baby without also using formula. The more often you pump, the more milk you will produce.  °WHEN SHOULD I PUMP?  °· You can begin to pump soon after delivery. However, some experts recommend waiting about 4 weeks before giving your infant a bottle to make sure breastfeeding is going well.  °· If you plan to return to work, begin pumping a few weeks before. This will help you develop techniques that work best for you. It also lets you build up a supply of breast milk.   °· When you are with your infant, feed on demand and pump after each feeding.   °· When you are away from your infant for several hours, pump for about 15 minutes every 2-3 hours. Pump both breasts at the same time if you can.   °· If your infant has a formula feeding, make sure to pump around the same time.     °· If you drink any alcohol, wait 2 hours before pumping.   °HOW DO I PREPARE TO PUMP? °Your let-down reflex is the natural reaction to stimulation that makes your breast milk flow. It is easier to stimulate this reflex when you are relaxed. Find relaxation techniques that work for you. If you have difficulty with your let-down reflex, try these methods:  °· Smell one of your infant's blankets or an item of clothing.   °· Look at a picture or video of your infant.   °· Sit in a quiet, private space.   °· Massage the breast you plan to pump.   °· Place soothing warmth on the breast.   °· Play relaxing music.   °WHAT ARE SOME GENERAL BREAST PUMPING TIPS? °· Wash your hands before you pump. You  do not need to wash your nipples or breasts. °· There are three ways to pump. °¨ You can use your hand to massage and compress your breast. °¨ You can use a handheld manual pump. °¨ You can use an electric pump.   °· Make sure the suction cup (flange) on the breast pump is the right size. Place the flange directly over the nipple. If it is the wrong size or placed the wrong way, it may be painful and cause nipple damage.   °· If pumping is uncomfortable, apply a small amount of purified or modified lanolin to your nipple and areola. °· If you are using an electric pump, adjust the speed and suction power to be more comfortable. °· If pumping is painful or if you are not getting very much milk, you may need a different type of pump. A lactation consultant can help you determine what type of pump to use.   °· Keep a full water bottle near you at all times. Drinking lots of fluid helps you make more milk.  °· You can store your milk to use later. Pumped breast milk can be stored in a sealable, sterile container or plastic bag. Label all stored breast milk with the date you pumped it. °¨ Milk can stay out at room temperature for up to 8 hours. °¨   You can store your milk in the refrigerator for up to 8 days. °¨ You can store your milk in the freezer for 3 months. Thaw frozen milk using warm water. Do not put it in the microwave. °· Do not smoke. Smoking can lower your milk supply and harm your infant. If you need help quitting, ask your health care provider to recommend a program.   °WHEN SHOULD I CALL MY HEALTH CARE PROVIDER OR A LACTATION CONSULTANT? °· You are having trouble pumping. °· You are concerned that you are not making enough milk. °· You have nipple pain, soreness, or redness. °· You want to use birth control. Birth control pills may lower your milk supply. Talk to your health care provider about your options. °Document Released: 08/19/2009 Document Revised: 03/06/2013 Document Reviewed:  12/22/2012 °Exit[employer]® Patient Information ©2015 [employer]. This information is not intended to replace advice given to you by your health care provider. Make sure you discuss any questions you have with your health care provider. ° °Nutrition for the New Mother  °A new mother needs good health and nutrition so she can have energy to take care of a new baby. Whether a mother breastfeeds or formula feeds the baby, it is important to have a well-balanced diet. Foods from all the food groups should be chosen to meet the new mother's energy needs and to give her the nutrients needed for repair and healing.  °A HEALTHY EATING PLAN °The My [employer] plan for Moms outlines what you should eat to help you and your baby stay healthy. The energy and amount of food you need depends on whether or not you are breastfeeding. If you are breastfeeding you will need more nutrients. If you choose not to breastfeed, your nutrition goal should be to return to a healthy weight. Limiting calories may be needed if you are not breastfeeding.  °HOME CARE INSTRUCTIONS  °· For a personal plan based on your unique needs, see your Registered Dietitian or visit www.mypyramid.gov. °· Eat a variety of foods. The plan below will help guide you. The following chart has a suggested daily meal plan from the [employer] for Moms. °· Eat a variety of fruits and vegetables. °· Eat more dark green and orange vegetables and cooked dried beans. °· Make half your grains whole grains. Choose whole instead of refined grains. °· Choose low-fat or lean meats and poultry. °· Choose low-fat or fat-free dairy products like milk, cheese, or yogurt. °Fruits °· Breastfeeding: 2 cups °· Non-Breastfeeding: 2 cups °· What Counts as a serving? °¨ 1 cup of fruit or juice. °¨ ½ cup dried fruit. °Vegetables °· Breastfeeding: 3 cups °· Non-Breastfeeding: 2 ½ cups °· What Counts as a serving? °¨ 1 cup raw or cooked vegetables. °¨ Juice or 2 cups raw leafy  vegetables. °Grains °· Breastfeeding: 8 oz °· Non-Breastfeeding: 6 oz °· What Counts as a serving? °¨ 1 slice bread. °¨ 1 oz ready-to-eat cereal. °¨ ½ cup cooked pasta, rice, or cereal. °Meat and Beans °· Breastfeeding: 6 ½ oz °· Non-Breastfeeding: 5 ½ oz °· What Counts as a serving? °¨ 1 oz lean meat, poultry, or fish °¨ ¼ cup cooked dry beans °¨ ½ oz nuts or 1 egg °¨ 1 tbs peanut butter °Milk °· Breastfeeding: 3 cups °· Non-Breastfeeding: 3 cups °· What Counts as a serving? °¨ 1 cup milk. °¨ 8 oz yogurt. °¨ 1 ½ oz cheese. °¨ 2 oz processed cheese. °TIPS FOR THE BREASTFEEDING MOM °· Rapid weight   loss is not suggested when you are breastfeeding. By simply breastfeeding, you will be able to lose the weight gained during your pregnancy. Your caregiver can keep track of your weight and tell you if your weight loss is appropriate. °· Be sure to drink fluids. You may notice that you are thirstier than usual. A suggestion is to drink a glass of water or other beverage whenever you breastfeed. °· Avoid alcohol as it can be passed into your breast milk. °· Limit caffeine drinks to no more than 2 to 3 cups per day. °· You may need to keep taking your prenatal vitamin while you are breastfeeding. Talk with your caregiver about taking a vitamin or supplement. °RETURING TO A HEALTHY WEIGHT °· The My [employer] Plan for Moms will help you return to a healthy weight. It will also provide the nutrients you need. °· You may need to limit "empty" calories. These include: °¨ High fat foods like fried foods, fatty meats, fast food, butter, and mayonnaise. °¨ High sugar foods like sodas, jelly, candy, and sweets. °· Be physically active. Include 30 minutes of exercise or more each day. Choose an activity you like such as walking, swimming, biking, or aerobics. Check with your caregiver before you start to exercise. °Document Released: 06/08/2007 Document Revised: 05/24/2011 Document Reviewed: 06/08/2007 °ExitCare® Patient Information  ©2015 [employer]. This information is not intended to replace advice given to you by your health care provider. Make sure you discuss any questions you have with your health care provider. °Postpartum Depression and Baby Blues °The postpartum period begins right after the birth of a baby. During this time, there is often a great amount of joy and excitement. It is also a time of many changes in the life of the parents. Regardless of how many times a mother gives birth, each child brings new challenges and dynamics to the family. It is not unusual to have feelings of excitement along with confusing shifts in moods, emotions, and thoughts. All mothers are at risk of developing postpartum depression or the "baby blues." These mood changes can occur right after giving birth, or they may occur many months after giving birth. The baby blues or postpartum depression can be mild or severe. Additionally, postpartum depression can go away rather quickly, or it can be a long-term condition.  °CAUSES °Raised hormone levels and the rapid drop in those levels are thought to be a main cause of postpartum depression and the baby blues. A number of hormones change during and after pregnancy. Estrogen and progesterone usually decrease right after the delivery of your baby. The levels of thyroid hormone and various cortisol steroids also rapidly drop. Other factors that play a role in these mood changes include major life events and genetics.  °RISK FACTORS °If you have any of the following risks for the baby blues or postpartum depression, know what symptoms to watch out for during the postpartum period. Risk factors that may increase the likelihood of getting the baby blues or postpartum depression include: °· Having a personal or family history of depression.   °· Having depression while being pregnant.   °· Having premenstrual mood issues or mood issues related to oral contraceptives. °· Having a lot of life stress.   °· Having  marital conflict.   °· Lacking a social support network.   °· Having a baby with special needs.   °· Having health problems, such as diabetes.   °SIGNS AND SYMPTOMS °Symptoms of baby blues include: °· Brief changes in mood, such as going   from extreme happiness to sadness. °· Decreased concentration.   °· Difficulty sleeping.   °· Crying spells, tearfulness.   °· Irritability.   °· Anxiety.   °Symptoms of postpartum depression typically begin within the first month after giving birth. These symptoms include: °· Difficulty sleeping or excessive sleepiness.   °· Marked weight loss.   °· Agitation.   °· Feelings of worthlessness.   °· Lack of interest in activity or food.   °Postpartum psychosis is a very serious condition and can be dangerous. Fortunately, it is rare. Displaying any of the following symptoms is cause for immediate medical attention. Symptoms of postpartum psychosis include:  °· Hallucinations and delusions.   °· Bizarre or disorganized behavior.   °· Confusion or disorientation.   °DIAGNOSIS  °A diagnosis is made by an evaluation of your symptoms. There are no medical or lab tests that lead to a diagnosis, but there are various questionnaires that a health care provider may use to identify those with the baby blues, postpartum depression, or psychosis. Often, a screening tool called the Edinburgh Postnatal Depression Scale is used to diagnose depression in the postpartum period.  °TREATMENT °The baby blues usually goes away on its own in 1-2 weeks. Social support is often all that is needed. You will be encouraged to get adequate sleep and rest. Occasionally, you may be given medicines to help you sleep.  °Postpartum depression requires treatment because it can last several months or longer if it is not treated. Treatment may include individual or group therapy, medicine, or both to address any social, physiological, and psychological factors that may play a role in the depression. Regular exercise, a  healthy diet, rest, and social support may also be strongly recommended.  °Postpartum psychosis is more serious and needs treatment right away. Hospitalization is often needed. °HOME CARE INSTRUCTIONS °· Get as much rest as you can. Nap when the baby sleeps.   °· Exercise regularly. Some women find yoga and walking to be beneficial.   °· Eat a balanced and nourishing diet.   °· Do little things that you enjoy. Have a cup of tea, take a bubble bath, read your favorite magazine, or listen to your favorite music. °· Avoid alcohol.   °· Ask for help with household chores, cooking, grocery shopping, or running errands as needed. Do not try to do everything.   °· Talk to people close to you about how you are feeling. Get support from your partner, family members, friends, or other new moms. °· Try to stay positive in how you think. Think about the things you are grateful for.   °· Do not spend a lot of time alone.   °· Only take over-the-counter or prescription medicine as directed by your health care provider. °· Keep all your postpartum appointments.   °· Let your health care provider know if you have any concerns.   °SEEK MEDICAL CARE IF: °You are having a reaction to or problems with your medicine. °SEEK IMMEDIATE MEDICAL CARE IF: °· You have suicidal feelings.   °· You think you may harm the baby or someone else. °MAKE SURE YOU: °· Understand these instructions. °· Will watch your condition. °· Will get help right away if you are not doing well or get worse. °Document Released: 12/04/2003 Document Revised: 03/06/2013 Document Reviewed: 12/11/2012 °Exit[employer]® Patient Information ©2015 [employer]. This information is not intended to replace advice given to you by your health care provider. Make sure you discuss any questions you have with your health care provider. °Breastfeeding and Mastitis °Mastitis is inflammation of the breast tissue. It can occur in women who   are breastfeeding. This can make breastfeeding  painful. Mastitis will sometimes go away on its own. Your health care provider will help determine if treatment is needed. °CAUSES °Mastitis is often associated with a blocked milk (lactiferous) duct. This can happen when too much milk builds up in the breast. Causes of excess milk in the breast can include: °· Poor latch-on. If your baby is not latched onto the breast properly, she or he may not empty your breast completely while breastfeeding. °· Allowing too much time to pass between feedings. °· Wearing a bra or other clothing that is too tight. This puts extra pressure on the lactiferous ducts so milk does not flow through them as it should. °Mastitis can also be caused by a bacterial infection. Bacteria may enter the breast tissue through cuts or openings in the skin. In women who are breastfeeding, this may occur because of cracked or irritated skin. Cracks in the skin are often caused when your baby does not latch on properly to the breast. °SIGNS AND SYMPTOMS °· Swelling, redness, tenderness, and pain in an area of the breast. °· Swelling of the glands under the arm on the same side. °· Fever may or may not accompany mastitis. °If an infection is allowed to progress, a collection of pus (abscess) may develop. °DIAGNOSIS  °Your health care provider can usually diagnose mastitis based on your symptoms and a physical exam. Tests may be done to help confirm the diagnosis. These may include: °· Removal of pus from the breast by applying pressure to the area. This pus can be examined in the lab to determine which bacteria are present. If an abscess has developed, the fluid in the abscess can be removed with a needle. This can also be used to confirm the diagnosis and determine the bacteria present. In most cases, pus will not be present. °· Blood tests to determine if your body is fighting a bacterial infection. °· Mammogram or ultrasound tests to rule out other problems or diseases. °TREATMENT  °Mastitis that  occurs with breastfeeding will sometimes go away on its own. Your health care provider may choose to wait 24 hours after first seeing you to decide whether a prescription medicine is needed. If your symptoms are worse after 24 hours, your health care provider will likely prescribe an antibiotic medicine to treat the mastitis. He or she will determine which bacteria are most likely causing the infection and will then select an appropriate antibiotic medicine. This is sometimes changed based on the results of tests performed to identify the bacteria, or if there is no response to the antibiotic medicine selected. Antibiotic medicines are usually given by mouth. You may also be given medicine for pain. °HOME CARE INSTRUCTIONS °· Only take over-the-counter or prescription medicines for pain, fever, or discomfort as directed by your health care provider. °· If your health care provider prescribed an antibiotic medicine, take the medicine as directed. Make sure you finish it even if you start to feel better. °· Do not wear a tight or underwire bra. Wear a soft, supportive bra. °· Increase your fluid intake, especially if you have a fever. °· Continue to empty the breast. Your health care provider can tell you whether this milk is safe for your infant or needs to be thrown out. You may be told to stop nursing until your health care provider thinks it is safe for your baby. Use a breast pump if you are advised to stop nursing. °· Keep your nipples   clean and dry. °· Empty the first breast completely before going to the other breast. If your baby is not emptying your breasts completely for some reason, use a breast pump to empty your breasts. °· If you go back to work, pump your breasts while at work to stay in time with your nursing schedule. °· Avoid allowing your breasts to become overly filled with milk (engorged). °SEEK MEDICAL CARE IF: °· You have pus-like discharge from the breast. °· Your symptoms do not improve with  the treatment prescribed by your health care provider within 2 days. °SEEK IMMEDIATE MEDICAL CARE IF: °· Your pain and swelling are getting worse. °· You have pain that is not controlled with medicine. °· You have a red line extending from the breast toward your armpit. °· You have a fever or persistent symptoms for more than 2-3 days. °· You have a fever and your symptoms suddenly get worse. °MAKE SURE YOU:  °· Understand these instructions. °· Will watch your condition. °· Will get help right away if you are not doing well or get worse. °Document Released: 06/26/2004 Document Revised: 03/06/2013 Document Reviewed: 10/05/2012 °[employer]® Patient Information ©2015 [employer]. This information is not intended to replace advice given to you by your health care provider. Make sure you discuss any questions you have with your health care provider. °Breastfeeding °Deciding to breastfeed is one of the best choices you can make for you and your baby. A change in hormones during pregnancy causes your breast tissue to grow and increases the number and size of your milk ducts. These hormones also allow proteins, sugars, and fats from your blood supply to make breast milk in your milk-producing glands. Hormones prevent breast milk from being released before your baby is born as well as prompt milk flow after birth. Once breastfeeding has begun, thoughts of your baby, as well as his or her sucking or crying, can stimulate the release of milk from your milk-producing glands.  °BENEFITS OF BREASTFEEDING °For Your Baby °· Your first milk (colostrum) helps your baby's digestive system function better.   °· There are antibodies in your milk that help your baby fight off infections.   °· Your baby has a lower incidence of asthma, allergies, and sudden infant death syndrome.   °· The nutrients in breast milk are better for your baby than infant formulas and are designed uniquely for your baby's needs.   °· Breast milk improves your  baby's brain development.   °· Your baby is less likely to develop other conditions, such as childhood obesity, asthma, or type 2 diabetes mellitus.   °For You  °· Breastfeeding helps to create a very special bond between you and your baby.   °· Breastfeeding is convenient. Breast milk is always available at the correct temperature and costs nothing.   °· Breastfeeding helps to burn calories and helps you lose the weight gained during pregnancy.   °· Breastfeeding makes your uterus contract to its prepregnancy size faster and slows bleeding (lochia) after you give birth.   °· Breastfeeding helps to lower your risk of developing type 2 diabetes mellitus, osteoporosis, and breast or ovarian cancer later in life. °SIGNS THAT YOUR BABY IS HUNGRY °Early Signs of Hunger  °· Increased alertness or activity. °· Stretching. °· Movement of the head from side to side. °· Movement of the head and opening of the mouth when the corner of the mouth or cheek is stroked (rooting). °· Increased sucking sounds, smacking lips, cooing, sighing, or squeaking. °· Hand-to-mouth movements. °· Increased sucking of   fingers or hands. °Late Signs of Hunger °· Fussing. °· Intermittent crying. °Extreme Signs of Hunger °Signs of extreme hunger will require calming and consoling before your baby will be able to breastfeed successfully. Do not wait for the following signs of extreme hunger to occur before you initiate breastfeeding:   °· Restlessness. °· A loud, strong cry. °·  Screaming. °BREASTFEEDING BASICS °Breastfeeding Initiation °· Find a comfortable place to sit or lie down, with your neck and back well supported. °· Place a pillow or rolled up blanket under your baby to bring him or her to the level of your breast (if you are seated). Nursing pillows are specially designed to help support your arms and your baby while you breastfeed. °· Make sure that your baby's abdomen is facing your abdomen.   °· Gently massage your breast. With your  fingertips, massage from your chest wall toward your nipple in a circular motion. This encourages milk flow. You may need to continue this action during the feeding if your milk flows slowly. °· Support your breast with 4 fingers underneath and your thumb above your nipple. Make sure your fingers are well away from your nipple and your baby's mouth.   °· Stroke your baby's lips gently with your finger or nipple.   °· When your baby's mouth is open wide enough, quickly bring your baby to your breast, placing your entire nipple and as much of the colored area around your nipple (areola) as possible into your baby's mouth.   °¨ More areola should be visible above your baby's upper lip than below the lower lip.   °¨ Your baby's tongue should be between his or her lower gum and your breast.   °· Ensure that your baby's mouth is correctly positioned around your nipple (latched). Your baby's lips should create a seal on your breast and be turned out (everted). °· It is common for your baby to suck about 2-3 minutes in order to start the flow of breast milk. °Latching °Teaching your baby how to latch on to your breast properly is very important. An improper latch can cause nipple pain and decreased milk supply for you and poor weight gain in your baby. Also, if your baby is not latched onto your nipple properly, he or she may swallow some air during feeding. This can make your baby fussy. Burping your baby when you switch breasts during the feeding can help to get rid of the air. However, teaching your baby to latch on properly is still the best way to prevent fussiness from swallowing air while breastfeeding. °Signs that your baby has successfully latched on to your nipple:    °· Silent tugging or silent sucking, without causing you pain.   °· Swallowing heard between every 3-4 sucks.   °·  Muscle movement above and in front of his or her ears while sucking.   °Signs that your baby has not successfully latched on to  nipple:  °· Sucking sounds or smacking sounds from your baby while breastfeeding. °· Nipple pain. °If you think your baby has not latched on correctly, slip your finger into the corner of your baby's mouth to break the suction and place it between your baby's gums. Attempt breastfeeding initiation again. °Signs of Successful Breastfeeding °Signs from your baby:   °· A gradual decrease in the number of sucks or complete cessation of sucking.   °· Falling asleep.   °· Relaxation of his or her body.   °· Retention of a small amount of milk in his or her mouth.   °· Letting go   of your breast by himself or herself. °Signs from you: °· Breasts that have increased in firmness, weight, and size 1-3 hours after feeding.   °· Breasts that are softer immediately after breastfeeding. °· Increased milk volume, as well as a change in milk consistency and color by the fifth day of breastfeeding.   °· Nipples that are not sore, cracked, or bleeding. °Signs That Your Baby is Getting Enough Milk °· Wetting at least 3 diapers in a 24-hour period. The urine should be clear and pale yellow by age 5 days. °· At least 3 stools in a 24-hour period by age 5 days. The stool should be soft and yellow. °· At least 3 stools in a 24-hour period by age 7 days. The stool should be seedy and yellow. °· No loss of weight greater than 10% of birth weight during the first 3 days of age. °· Average weight gain of 4-7 ounces (113-198 g) per week after age 4 days. °· Consistent daily weight gain by age 5 days, without weight loss after the age of 2 weeks. °After a feeding, your baby may spit up a small amount. This is common. °BREASTFEEDING FREQUENCY AND DURATION °Frequent feeding will help you make more milk and can prevent sore nipples and breast engorgement. Breastfeed when you feel the need to reduce the fullness of your breasts or when your baby shows signs of hunger. This is called "breastfeeding on demand." Avoid introducing a pacifier to your  baby while you are working to establish breastfeeding (the first 4-6 weeks after your baby is born). After this time you may choose to use a pacifier. Research has shown that pacifier use during the first year of a baby's life decreases the risk of sudden infant death syndrome (SIDS). °Allow your baby to feed on each breast as long as he or she wants. Breastfeed until your baby is finished feeding. When your baby unlatches or falls asleep while feeding from the first breast, offer the second breast. Because newborns are often sleepy in the first few weeks of life, you may need to awaken your baby to get him or her to feed. °Breastfeeding times will vary from baby to baby. However, the following rules can serve as a guide to help you ensure that your baby is properly fed: °· Newborns (babies 4 weeks of age or younger) may breastfeed every 1-3 hours. °· Newborns should not go longer than 3 hours during the day or 5 hours during the night without breastfeeding. °· You should breastfeed your baby a minimum of 8 times in a 24-hour period until you begin to introduce solid foods to your baby at around 6 months of age. °BREAST MILK PUMPING °Pumping and storing breast milk allows you to ensure that your baby is exclusively fed your breast milk, even at times when you are unable to breastfeed. This is especially important if you are going back to work while you are still breastfeeding or when you are not able to be present during feedings. Your lactation consultant can give you guidelines on how long it is safe to store breast milk.  °A breast pump is a machine that allows you to pump milk from your breast into a sterile bottle. The pumped breast milk can then be stored in a refrigerator or freezer. Some breast pumps are operated by hand, while others use electricity. Ask your lactation consultant which type will work best for you. Breast pumps can be purchased, but some hospitals and breastfeeding support groups   lease  breast pumps on a monthly basis. A lactation consultant can teach you how to hand express breast milk, if you prefer not to use a pump.  °CARING FOR YOUR BREASTS WHILE YOU BREASTFEED °Nipples can become dry, cracked, and sore while breastfeeding. The following recommendations can help keep your breasts moisturized and healthy: °· Avoid using soap on your nipples.   °· Wear a supportive bra. Although not required, special nursing bras and tank tops are designed to allow access to your breasts for breastfeeding without taking off your entire bra or top. Avoid wearing underwire-style bras or extremely tight bras. °· Air dry your nipples for 3-4 minutes after each feeding.   °· Use only cotton bra pads to absorb leaked breast milk. Leaking of breast milk between feedings is normal.   °· Use lanolin on your nipples after breastfeeding. Lanolin helps to maintain your skin's normal moisture barrier. If you use pure lanolin, you do not need to wash it off before feeding your baby again. Pure lanolin is not toxic to your baby. You may also hand express a few drops of breast milk and gently massage that milk into your nipples and allow the milk to air dry. °In the first few weeks after giving birth, some women experience extremely full breasts (engorgement). Engorgement can make your breasts feel heavy, warm, and tender to the touch. Engorgement peaks within 3-5 days after you give birth. The following recommendations can help ease engorgement: °· Completely empty your breasts while breastfeeding or pumping. You may want to start by applying warm, moist heat (in the shower or with warm water-soaked hand towels) just before feeding or pumping. This increases circulation and helps the milk flow. If your baby does not completely empty your breasts while breastfeeding, pump any extra milk after he or she is finished. °· Wear a snug bra (nursing or regular) or tank top for 1-2 days to signal your body to slightly decrease milk  production. °· Apply ice packs to your breasts, unless this is too uncomfortable for you. °· Make sure that your baby is latched on and positioned properly while breastfeeding. °If engorgement persists after 48 hours of following these recommendations, contact your health care provider or a lactation consultant. °OVERALL HEALTH CARE RECOMMENDATIONS WHILE BREASTFEEDING °· Eat healthy foods. Alternate between meals and snacks, eating 3 of each per day. Because what you eat affects your breast milk, some of the foods may make your baby more irritable than usual. Avoid eating these foods if you are sure that they are negatively affecting your baby. °· Drink milk, fruit juice, and water to satisfy your thirst (about 10 glasses a day).   °· Rest often, relax, and continue to take your prenatal vitamins to prevent fatigue, stress, and anemia. °· Continue breast self-awareness checks. °· Avoid chewing and smoking tobacco. °· Avoid alcohol and drug use. °Some medicines that may be harmful to your baby can pass through breast milk. It is important to ask your health care provider before taking any medicine, including all over-the-counter and prescription medicine as well as vitamin and herbal supplements. °It is possible to become pregnant while breastfeeding. If birth control is desired, ask your health care provider about options that will be safe for your baby. °SEEK MEDICAL CARE IF:  °· You feel like you want to stop breastfeeding or have become frustrated with breastfeeding. °· You have painful breasts or nipples. °· Your nipples are cracked or bleeding. °· Your breasts are red, tender, or warm. °· You have   a swollen area on either breast. °· You have a fever or chills. °· You have nausea or vomiting. °· You have drainage other than breast milk from your nipples. °· Your breasts do not become full before feedings by the fifth day after you give birth. °· You feel sad and depressed. °· Your baby is too sleepy to eat  well. °· Your baby is having trouble sleeping.   °· Your baby is wetting less than 3 diapers in a 24-hour period. °· Your baby has less than 3 stools in a 24-hour period. °· Your baby's skin or the white part of his or her eyes becomes yellow.   °· Your baby is not gaining weight by 5 days of age. °SEEK IMMEDIATE MEDICAL CARE IF:  °· Your baby is overly tired (lethargic) and does not want to wake up and feed. °· Your baby develops an unexplained fever. °Document Released: 03/01/2005 Document Revised: 03/06/2013 Document Reviewed: 08/23/2012 °Exit[employer]® Patient Information ©2015 [employer]. This information is not intended to replace advice given to you by your health care provider. Make sure you discuss any questions you have with your health care provider. ° °

## 2013-10-31 NOTE — Plan of Care (Signed)
Problem: Discharge Progression Outcomes Goal: Barriers To Progression Addressed/Resolved Outcome: Completed/Met Date Met:  10/31/13 Dc teaching taught . Patient doing well.

## 2013-10-31 NOTE — Lactation Note (Signed)
This note was copied from the chart of Jessica [name]. Lactation Consultation Note  Patient Name: Jessica [name]: 10/31/2013 Reason for consult: Follow-up assessment;Infant < 6lbs [facility] and [facility] just finished her feeding. Mom reports she BF for 30 minutes while she use curved tipped syringe to supplement. Mom reports she plans to use bottle to supplement at home. LC advised Mom to use DR. Brown bottle with preemie nipple. Offered to schedule OP follow up for feeding assessment, Mom declined advising she will call. Engorgement care reviewed if needed. Mom to be sure baby is at the breast 8-12 times in 24 hours, limit feedings to 30 minutes, supplement according to guidelines reviewed with Mom. Post pump after feedings till her milk comes in well. Encouraged to come to support group. Encouraged to have weight check, feeding assessment next week.   Maternal Data    Feeding Feeding Type: Breast Fed Length of feed: 30 min  LATCH Score/Interventions                      Lactation Tools Discussed/Used Tools: Pump Breast pump type: Double-Electric Breast Pump   Consult Status Consult Status: Complete [name]: 10/31/13 Follow-up type: In-patient    Jessica [name], Jessica [name] 10/31/2013, 12:08 PM

## 2013-10-31 NOTE — Lactation Note (Signed)
This note was copied from the chart of Jessica [name]. Lactation Consultation Note  Patient Name: Jessica [name] XBJYN'WToday's Date: 10/31/2013 Reason for consult: Follow-up assessment;Infant < 6lbs Mom is breastfeeding and supplementing at breast using curved tipped syringe w/formula. Mom reports she is pumping but only getting few drops. Mom has DEBP for home use. Mom reports she is supplementing with 30 ml of Similac 22 cal formula with each feeding. Baby has voided 5 times in past 24 hours with total of 9 in her life, last stool was 0400 on 10/30/13, total of 4 stools in life. Mom is 48 yo with history of infertility. LC discussed Mom's history with her and feeding plan to help maximize milk production while conserving baby's energy with feedings. Discussed with Mom that she should continue to supplement till milk comes in well and baby back to birth weight. Advised Mom with her history she may need to continue to supplement with feedings while she continues to BF. Discussed with Mom energy use at the breast and encouraged her to follow LPT policy by limiting feedings at the breast to 30 minutes. Discussed using SNS/bottle to supplement. Baby asleep at this visit, LC left phone number for Mom to call with next feeding for LC to observe and finalize a feeding plan before d/c home. Mom to advise LC how she plans to continue to supplement.   Maternal Data    Feeding    LATCH Score/Interventions                      Lactation Tools Discussed/Used Tools: Pump Breast pump type: Double-Electric Breast Pump   Consult Status Consult Status: Follow-up Date: 10/31/13 Follow-up type: In-patient    Jessica LevinsGranger, Jessica [name] Ann 10/31/2013, 11:57 AM

## 2013-11-02 ENCOUNTER — Encounter (HOSPITAL_COMMUNITY): Payer: Self-pay | Admitting: Obstetrics

## 2013-11-02 NOTE — Addendum Note (Signed)
Addendum created 11/02/13 1622 by Tyrone AppleMichael A. Malen GauzeFoster, MD   Modules edited: Anesthesia Events

## 2013-11-08 ENCOUNTER — Ambulatory Visit (HOSPITAL_COMMUNITY)
Admission: RE | Admit: 2013-11-08 | Discharge: 2013-11-08 | Disposition: A | Discharge status: Home / Self Care | Payer: BC Managed Care – PPO | Source: Ambulatory Visit | Attending: Obstetrics | Admitting: Obstetrics

## 2013-11-08 NOTE — Lactation Note (Signed)
Lactation Consult  Mother's reason for visit:  Low milk supply Visit Type:  Feeding assessment Appointment Notes:  None Consult:  Initial Lactation Consultant:  Huston Foley  ________________________________________________________________________  Baby's Name: Jessica [name]  Date of Birth: 10/28/2014  Pediatrician: Dr. Carmon Ginsberg  Gender: female  Gestational Age: [redacted]w[redacted]d (At Birth)  Birth Weight: 4 lb 14.3 oz (2220 g)  Weight at Discharge: Weight: 4 lb 8.7 oz (2060 g) Date of Discharge: 10/31/2013  Filed Weights   10/29/13 2338 10/30/13 2355 10/31/13 0344  Weight: 4 lb 10.3 oz (2105 g) 4 lb 14.4 oz (2223 g) 4 lb 8.7 oz (2060 g)  [facility]: 4-14.5Location:Pediatrician's office on 11/05/13 Weight today: 5-1   ________________________________________________________________________  Mother's Name: Jessica [name] Type of delivery:  C/S Breastfeeding Experience:  FIRST BABY Maternal Medical Conditions:  none Maternal Medications:  PNV'S, IRON, MAGNESIUM  ________________________________________________________________________  Breastfeeding History (Post Discharge)  Frequency of breastfeeding:  EVERY 3 HOURS Duration of feeding:  15-20 MINUTES  Supplementation  Formula:  Volume 60ml Frequency:  EVERY 3 HOURS Total volume per day:        Brand: Similac  Breastmilk:  Volume 0-77ml Frequency:  ONCE PER DAY Total volume per day: 0-69ml  Method:  Bottle,   Pumping  Type of pump:  SPECTRA Frequency:  0-3 TIMES PER DAY Volume:  0-79ml  Infant Intake and Output Assessment  Voids:  8 in 24 hrs.  Color:  Clear yellow Stools:  0-1 in 24 hrs.  Color:  Brown  ________________________________________________________________________  Maternal Breast Assessment  Breast:  Soft Nipple:  Erect Pain level:  0   _______________________________________________________________________ Feeding Assessment/Evaluation  Mom and 49  day old baby here for feeding assessment.  Baby was born at 37 weeks due to IUGR.   Mom is 48 years old and pregnancy was a result of donor embryo.  She states she did have breast changes with pregnancy.  Her breasts are very soft today.  The most she has pumped is 10 mls.  Observed baby latch well to the breast and nurse actively for 35 minutes.  Milk transfer was only 2 mls.  Discussed continuing to try to increase milk supply over next 2weeks  and by then she will have a better assessment of supply.  She was pumping regularly but not in the past few days.  Recommended she continue breastfeeding first with feeding cues, begin pumping 4 times in 24 hours after feeds and consider taking fenugreek and mother's milk plus.  Discussed domperidone and information given for her to research and consider.  We talked about low milk supply and sometimes we are not sure why it occurs.  She does know that her age could be a factor.  She states she felt prepared that it may not work.  Encouraged breastfeeding support groups for additional pre and post weights and a good opportunity to meet other new moms.  Encouraged to call us with any concerns or for follow up appointment prn.  Initial feeding assessment:  Infant's oral assessment:  WNL  Positioning:  Cradle Right breast/LEFT BREAST  LATCH documentation:  Latch:  2 = Grasps breast easily, tongue down, lips flanged, rhythmical sucking.  Audible swallowing:  1 = A few with stimulation  Type of nipple:  2 = Everted at rest and after stimulation  Comfort (Breast/Nipple):  2 = Soft / non-tender  Hold (Positioning):  2 = No assistance needed to correctly position infant at  breast  LATCH score:  9  Attached assessment:  Deep  Lips flanged:  Yes.    Lips untucked:  No.  Suck assessment:  Displays both  Tools:  Bottle Instructed on use and cleaning of tool:  Yes.    Pre-feed weight:  2296 g Post-feed weight:  2298g Amount transferred:  2 ml Amount  supplemented:  60ml      Total amount transferred:  2 ml Total supplement given:  60 ml

## 2013-11-12 ENCOUNTER — Ambulatory Visit (HOSPITAL_COMMUNITY): Payer: BC Managed Care – PPO

## 2013-11-17 ENCOUNTER — Inpatient Hospital Stay (HOSPITAL_COMMUNITY): Admission: AD | Admit: 2013-11-17 | Payer: BC Managed Care – PPO | Source: Ambulatory Visit | Admitting: Obstetrics

## 2014-01-14 ENCOUNTER — Encounter (HOSPITAL_COMMUNITY): Payer: Self-pay | Admitting: Obstetrics

## 2014-03-13 ENCOUNTER — Encounter: Payer: Self-pay | Admitting: Skilled Nursing Facility1

## 2014-03-13 ENCOUNTER — Encounter: Payer: BC Managed Care – PPO | Attending: Obstetrics | Admitting: Skilled Nursing Facility1

## 2014-03-13 VITALS — Ht 63.0 in | Wt 139.0 lb

## 2014-03-13 DIAGNOSIS — Z6824 Body mass index (BMI) 24.0-24.9, adult: Secondary | ICD-10-CM | POA: Diagnosis not present

## 2014-03-13 DIAGNOSIS — Z713 Dietary counseling and surveillance: Secondary | ICD-10-CM | POA: Diagnosis not present

## 2014-03-13 DIAGNOSIS — E669 Obesity, unspecified: Secondary | ICD-10-CM | POA: Insufficient documentation

## 2014-03-13 NOTE — Progress Notes (Signed)
Medical Nutrition Therapy:  Appt start time: 1100 end time:  1200.   Assessment:  Primary concerns today: referred for obesity.  Pt was on a diuretic but was taken off of it but she still feels she has fluid retention in her hands with numbness in the fingers. She will see an orthopedist for possible nerve issues. The patient states this fluid retention/numbness may be due to her weight gain. Patient states it took her one year and a half to get pregant and during that process went through hormone therapies, with weight compounding throughout that time. Patient states she is 20 pounds heavier than normally, she has done [employer] diet, [place] diet, and [employer] diet and is currently not consuming carbohydrates. She states that she wants to trick her body into losing weight as she always has but this time it is not working. She states she has always been on a diet with artificial sweetners so she has cut out all artifical sweetners. She states she is confused and does not know how to eat; she wonders if she should eat only protein in the morning or have carbohydrates at specific times of the day. Patient states she has had a chart of her weight since she was a child. She states she is normally around 118-120 pounds for years and then her weight would cycle with 10-20 pound changes. She does not like to take medicine. She first started dieting when she was in high school and claims she was heavier growing up (6th grade). She wants to lose weight in order to lower her risks in conceiving her next child which she hopes to conceive in the next year and a half. She has yet to talk to her doctor about losing weight before the next pregnancy and/or the healthy weight for going through another pregnancy at her age. She is not breast feeding. She weighs her food and normally works out two hours a day. She feels she needs to be in control over her diet and feels anxious when she is not. Patient states that after  her doctor telling her she was obese (when she was a child) she has only ever seen herself as obese and has been dieting ever since.Patient states she has about 4-5 cups of coffee with sugar daily.  The patient has recently given birth to a baby girl in august of this year which was with the patient in her appointment (she was a quiet baby).  The patients BMI is 24. Patient may have been in the obese range due to the need for a diuretic medication.   Preferred Learning Style:  No preference indicated   Learning Readiness:   Not ready   MEDICATIONS: pre-natal vitamin   DIETARY INTAKE:  Usual eating pattern includes 3 meals and 3 snacks per day.  Everyday foods include chicken.  Avoided foods include carbohydrates.    24-hr recall:  B ( AM): sprouted whole grain toast and almond butter with coffee and almond milk------protein shake Snk ( AM): nuts L ( PM): salad with chicken-----chicken sandwhich-----burger from fast food Snk ( PM): Kind bars D ( PM): chicken and vegetable Snk ( PM): nuts Beverages: water, coffee  Usual physical activity: pump class and spin 1 hour each (5 times a week)-before baby; after baby-3-4 times a week classes 1 hour each day  Estimated energy needs: 1800 calories 200 g carbohydrates 135 g protein 50 g fat  Progress Towards Goal(s):  In progress.   Nutritional Diagnosis:  NB-1.1 Food and nutrition-related knowledge deficit As related to consistently fad dieting .  As evidenced by patient report.    Intervention:  Nutrition counseling for proper nutrition. Dietitian educated the patient on the importance of a varied and balanced diet with the inclusion of all food groups including carbohydrates. Dietitian also educated the patient on hormone therapy changing the chemistry of her weight as well as her age playing a role in that.  Goals: -Eat according to MyPlate-Well balanced/varied -Do not over think food -Only use credible sources for nutrition  information such as the NIH, USDA, or AND -Love yourself!  Teaching Method Utilized:  Visual Auditory  Barriers to learning/adherence to lifestyle change: preconceived notions of nutrition with an emotional block.  Demonstrated degree of understanding via:  Teach Back   Monitoring/Evaluation:  Dietary intake prn.

## 2014-03-29 ENCOUNTER — Other Ambulatory Visit: Payer: Self-pay

## 2014-03-29 DIAGNOSIS — Z1231 Encounter for screening mammogram for malignant neoplasm of breast: Secondary | ICD-10-CM

## 2014-05-03 ENCOUNTER — Other Ambulatory Visit: Payer: Self-pay | Admitting: Nurse Practitioner

## 2014-05-03 DIAGNOSIS — E785 Hyperlipidemia, unspecified: Secondary | ICD-10-CM

## 2014-05-06 ENCOUNTER — Telehealth: Payer: Self-pay | Admitting: Cardiovascular Disease

## 2014-05-06 NOTE — Telephone Encounter (Signed)
Spoke with patient who had questions regarding her bicuspid aortic valve for her insurance company.  All questions answered to patient's satisfaction and patient thanked me for the call.

## 2014-05-06 NOTE — Telephone Encounter (Signed)
New Message      Patient needs a call back regarding question on insurance. Please give a call back.   Thanks

## 2014-05-14 ENCOUNTER — Ambulatory Visit
Admission: RE | Admit: 2014-05-14 | Discharge: 2014-05-14 | Disposition: A | Discharge status: Home / Self Care | Payer: BLUE CROSS/BLUE SHIELD | Source: Ambulatory Visit

## 2014-05-14 DIAGNOSIS — Z1231 Encounter for screening mammogram for malignant neoplasm of breast: Secondary | ICD-10-CM

## 2014-05-15 ENCOUNTER — Ambulatory Visit (INDEPENDENT_AMBULATORY_CARE_PROVIDER_SITE_OTHER): Payer: BLUE CROSS/BLUE SHIELD | Admitting: Cardiovascular Disease

## 2014-05-15 ENCOUNTER — Encounter: Payer: Self-pay | Admitting: Cardiovascular Disease

## 2014-05-15 ENCOUNTER — Other Ambulatory Visit (INDEPENDENT_AMBULATORY_CARE_PROVIDER_SITE_OTHER): Payer: BLUE CROSS/BLUE SHIELD | Admitting: *Deleted

## 2014-05-15 ENCOUNTER — Ambulatory Visit (INDEPENDENT_AMBULATORY_CARE_PROVIDER_SITE_OTHER)
Admission: RE | Admit: 2014-05-15 | Discharge: 2014-05-15 | Disposition: A | Discharge status: Home / Self Care | Payer: BLUE CROSS/BLUE SHIELD | Source: Ambulatory Visit | Attending: Cardiovascular Disease | Admitting: Cardiovascular Disease

## 2014-05-15 VITALS — BP 110/62 | HR 77 | Ht 63.0 in | Wt 129.0 lb

## 2014-05-15 DIAGNOSIS — Q231 Congenital insufficiency of aortic valve: Secondary | ICD-10-CM

## 2014-05-15 DIAGNOSIS — I77810 Thoracic aortic ectasia: Secondary | ICD-10-CM

## 2014-05-15 DIAGNOSIS — E785 Hyperlipidemia, unspecified: Secondary | ICD-10-CM

## 2014-05-15 LAB — HEPATIC FUNCTION PANEL
ALT: 25 U/L (ref 0–35)
AST: 30 U/L (ref 0–37)
Albumin: 4.4 g/dL (ref 3.5–5.2)
Alkaline Phosphatase: 55 U/L (ref 39–117)
BILIRUBIN DIRECT: 0.1 mg/dL (ref 0.0–0.3)
BILIRUBIN TOTAL: 0.4 mg/dL (ref 0.2–1.2)
Total Protein: 7.2 g/dL (ref 6.0–8.3)

## 2014-05-15 LAB — LIPID PANEL
Cholesterol: 154 mg/dL (ref 0–200)
HDL: 79.3 mg/dL (ref >39.00)
LDL CALC: 66 mg/dL (ref 0–99)
NONHDL: 74.7
TRIGLYCERIDES: 42 mg/dL (ref 0.0–149.0)
Total CHOL/HDL Ratio: 2
VLDL: 8.4 mg/dL (ref 0.0–40.0)

## 2014-05-15 LAB — BASIC METABOLIC PANEL
BUN: 16 mg/dL (ref 6–23)
CHLORIDE: 107 meq/L (ref 96–112)
CO2: 29 meq/L (ref 19–32)
Calcium: 9.6 mg/dL (ref 8.4–10.5)
Creatinine, Ser: 0.95 mg/dL (ref 0.40–1.20)
GFR: 66.25 mL/min (ref >60.00)
GLUCOSE: 94 mg/dL (ref 70–99)
POTASSIUM: 4.1 meq/L (ref 3.5–5.1)
SODIUM: 141 meq/L (ref 135–145)

## 2014-05-15 MED ORDER — IOHEXOL 350 MG/ML SOLN
100.0000 mL | Freq: Once | INTRAVENOUS | Status: AC | PRN
  Administered 2014-05-15: 80 mL via INTRAVENOUS

## 2014-05-15 NOTE — Patient Instructions (Signed)
Non-Cardiac CT Angiography (CTA), is a special type of CT scan that uses a computer to produce multi-dimensional views of major blood vessels throughout the body. In CT angiography, a contrast material is injected through an IV to help visualize the blood vessels  Your physician recommends that you continue on your current medications as directed. Please refer to the Current Medication list given to you today.  Your physician recommends that you schedule a follow-up appointment in: as needed with Dr. Elease HashimotoNahser ([place])

## 2014-05-15 NOTE — Progress Notes (Signed)
Cardiology Office Note   Date:  05/15/2014   ID:  Jessica [name], DOB March 26, 1965, MRN [id number]  PCP:  Jessica ColonelFOGLEMAN,Jessica A., MD  Cardiologist:   Vesta MixerNahser, Jessica J, MD   Chief Complaint  Patient presents with  . Follow-up    bicuspid aortic valve    1. Bicuspid aortic valve 2. Palpitations   History of Present Illness:  This is a 49 year old female with a history of a bicuspid aortic valve. She's had some intermittent chest pains in the past. She's had a few palpitations but nothing very serious. She's exercising on regular basis and works out 5 times a week. She's been really trying to cut down on her fat intake and carbohydrate intake.  Jan. 20, 2015:  Jessica [name] is doing well. She is pregnant.  No cardiac problems.    May 15, 2014   Jessica [name] is a 49 y.o. female who presents for follow-up of her bicuspid aortic valve. She's done well. She's had a baby since I last saw her. She is not having any issues.   No issues with pregnancy. She wants to have another baby.   Her last echocardiogram revealed normal left ventricle systolic motion. 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 She was found have mild dilatation of the proximal aorta.  [place] has a new [place] in [place] and is planning on moving in the next several weeks.    Past Medical History  Diagnosis Date  . Chest pain   . Palpitations   . History of bicuspid aortic valve   . AMA (advanced maternal age) multigravida 35+   . Vaginal Pap smear, abnormal   . Ovarian cyst   . Failed induction of labor 10/28/2013  . Postpartum care following cesarean delivery (8/16) 10/28/2013    Past Surgical History  Procedure Laterality Date  . [place] echocardiography  03/02/2006    EF 55-60%  . Knee arthrscopy  October 2012    left  . Knee arthroscopy  2010    right  . Strabismus surgery  1972    bothe eyes  . Tonsillectomy and adenoidectomy  1974  . Bilateral foot surgery  2006  .  Colposcopy    . Cesarean section N/A 10/28/2013    Procedure: CESAREAN SECTION;  Surgeon: Tresa EndoKelly A. Ernestina PennaFogleman, MD;  Location: [facility];  Service: Obstetrics;  Laterality: N/A;     Current Outpatient Prescriptions  Medication Sig Dispense Refill  . hydrochlorothiazide (HYDRODIURIL) 12.5 MG tablet Take 1 tablet (12.5 mg total) by mouth daily. 7 tablet 0  . iron polysaccharides (NIFEREX) 150 MG capsule Take 1 capsule (150 mg total) by mouth 2 (two) times daily. 60 capsule 0  . Prenat-FeFum-FePo-FA-Omega 3 (CONCEPT DHA) 53.5-38-1 MG CAPS Take 1 tablet by mouth daily.  4  . magnesium oxide (MAG-OX) 400 (241.3 MG) MG tablet Take 0.5 tablets (200 mg total) by mouth daily. (Patient not taking: Reported on 03/13/2014) 30 tablet 1  . ranitidine (ZANTAC) 150 MG tablet Take 150 mg by mouth daily as needed for heartburn.     No current facility-administered medications for this visit.    Allergies:   Aspirin; Ciprofloxacin hcl; Flagyl; Penicillins; Phenergan; and Prometrium    Social History:  The patient  reports that she has never smoked. She has never used smokeless tobacco. She reports that she drinks about 1.8 oz of alcohol per week. She reports that she does not use illicit drugs.  Family History:  The patient's family history includes Colon cancer in her paternal uncle; Diabetes in her maternal grandmother and mother; Hypertension in her maternal grandfather; Rheumatic fever in her sister.    ROS:  Please see the history of present illness.    Review of Systems: Constitutional:  denies fever, chills, diaphoresis, appetite change and fatigue.  HEENT: denies photophobia, eye pain, redness, hearing loss, ear pain, congestion, sore throat, rhinorrhea, sneezing, neck pain, neck stiffness and tinnitus.  Respiratory: denies SOB, DOE, cough, chest tightness, and wheezing.  Cardiovascular: denies chest pain, palpitations and leg swelling.  Gastrointestinal: denies nausea, vomiting, abdominal pain,  diarrhea, constipation, blood in stool.  Genitourinary: denies dysuria, urgency, frequency, hematuria, flank pain and difficulty urinating.  Musculoskeletal: denies  myalgias, back pain, joint swelling, arthralgias and gait problem.   Skin: denies pallor, rash and wound.  Neurological: denies dizziness, seizures, syncope, weakness, light-headedness, numbness and headaches.   Hematological: denies adenopathy, easy bruising, personal or family bleeding history.  Psychiatric/ Behavioral: denies suicidal ideation, mood changes, confusion, nervousness, sleep disturbance and agitation.       All other systems are reviewed and negative.    PHYSICAL EXAM: VS:  BP 110/62 mmHg  Pulse 77  Ht  (1.6 m)  Wt 129 lb (58.514 kg)  BMI 22.86 kg/m2  LMP 03/07/2014 , BMI Body mass index is 22.86 kg/(m^2). GEN: Well nourished, well developed, in no acute distress HEENT: normal Neck: no JVD, carotid bruits, or masses Cardiac: RRR; no murmurs, rubs, or gallops,no edema  Respiratory:  clear to auscultation bilaterally, normal work of breathing GI: soft, nontender, nondistended, + BS MS: no deformity or atrophy Skin: warm and dry, no rash Neuro:  Strength and sensation are intact Psych: normal   EKG:  EKG is ordered today. The ekg ordered today demonstrates normal sinus rhythm at 77. She has right axis deviation.   Recent Labs: 10/29/2013: Hemoglobin 9.7*; Platelets 247    Lipid Panel    Component Value Date/Time   CHOL 152 04/03/2013 0802   TRIG 79.0 04/03/2013 0802   HDL 72.40 04/03/2013 0802   CHOLHDL 2 04/03/2013 0802   VLDL 15.8 04/03/2013 0802   LDLCALC 64 04/03/2013 0802      Wt Readings from Last 3 Encounters:  05/15/14 129 lb (58.514 kg)  03/13/14 139 lb (63.05 kg)  10/26/13 159 lb (72.122 kg)      Other studies Reviewed: Additional studies/ records that were reviewed today include: . Review of the above records demonstrates:    ASSESSMENT AND PLAN:  1.  Bicuspid  aortic valve: Jessica [name] presents today for follow-up of her bicuspid aortic valve. She's had a baby since last saw her. Repeat echocardiogram last year revealed normal left ventricle systolic function. She has a functional bicuspid aortic valve. She was noted have mild dilatation of the proximal aorta.  She's planning on having another baby next year.  We will schedule her for a CT angiogram to make sure that she doesn't have an aortic aneurysm.   Current medicines are reviewed at length with the patient today.  The patient does not have concerns regarding medicines.  The following changes have been made:  no change   Disposition:   FU with me as needed.    Signed, [name], Deloris Ping, MD  05/15/2014 8:53 AM    [facility] [address] [place], [place], [place]  [address] Phone: [phone]; Fax: [phone]

## 2014-07-09 ENCOUNTER — Telehealth: Payer: Self-pay | Admitting: Cardiovascular Disease

## 2014-07-09 NOTE — Telephone Encounter (Signed)
New Message    [facility] is wanting to know if we received a Bicuspid Questionaire that was sent to the office. Please give them a call.

## 2014-07-09 NOTE — Telephone Encounter (Signed)
I notified agent with [facility] that I have not received a Bicuspid Questionnaire from their company.  She states that she will refax manually and I verified the fax number with her.  She thanked me for the call.

## 2014-07-12 NOTE — Telephone Encounter (Signed)
Received paperwork via email, completed and placed in [place] for faxing.

## 2014-09-05 NOTE — Addendum Note (Signed)
Addended by: Micki Riley C on: 09/05/2014 11:17 AM   Modules accepted: Orders

## 2014-10-09 ENCOUNTER — Telehealth: Payer: Self-pay | Admitting: Cardiovascular Disease

## 2014-10-09 NOTE — Telephone Encounter (Signed)
Reviewed most recent lab results with patient per her request and placed a copy in outgoing mail.

## 2014-10-09 NOTE — Telephone Encounter (Signed)
Follow Up    Pt requesting results from most recent blood work. Please call.

## 2014-10-30 ENCOUNTER — Telehealth: Payer: Self-pay | Admitting: Cardiovascular Disease

## 2014-10-30 NOTE — Telephone Encounter (Signed)
Patient needs a letter faxed to [phone] Dr. Letitia Libra with [facility]. Patient needs letter to state she is healthy in cardiovascular perspective to carry a pregnancy. Dr. Letitia Libra can be reach at [phone] if there are any questions. Patient stated that doctor was questioning aortic aneurysm on her CT. Will forward to Dr. Elease Hashimoto.

## 2014-10-30 NOTE — Telephone Encounter (Signed)
New Message  Pt calling to speak w/ RN about form/letter that Dr Elease Hashimoto needs to fill out stating that Pt is still healthy in cardiovascular perspective to carry a pregnancy. Please call back and discuss.

## 2014-10-30 NOTE — Telephone Encounter (Signed)
Please write a letter ,  I will sign it on Friday when I'm back in [place] has a bicuspid aortic valve and very minimal dilation of her ascending aorta. She is stable from a cardiac standpoint to go through pregnancy

## 2014-10-31 ENCOUNTER — Encounter: Payer: Self-pay | Admitting: Nurse Practitioner

## 2014-11-01 NOTE — Telephone Encounter (Signed)
I faxed letter to Dr. Letitia Libra at [phone]; confirmation received Patient aware

## 2014-11-06 ENCOUNTER — Telehealth: Payer: Self-pay | Admitting: Cardiovascular Disease

## 2014-11-06 NOTE — Telephone Encounter (Signed)
New Message      Pt calling stating that she recently relocated and pt needs to see a cardiologist and she wants to speak to [place]. Please call back and advise.

## 2014-11-07 NOTE — Telephone Encounter (Signed)
Spoke with patient who states she has an appointment with a new cardiologist in [place] next week.  She states she has a friend who recently had a MI due to a tear in the vessel from a condition called FMD.  She states according to her friend's doctor it is more likely to occur in females and is linked to pregnancy and hormone changes.  She is calling to ask Dr. Harvie Bridge advice regarding the likelihood that she could have a similar event occur.  I advised her that I will send message to him and call her back with his advice.  I transferred her to [facility] in [place] for help with sending her records to the new cardiology practice.

## 2014-11-08 NOTE — Telephone Encounter (Signed)
Follow up       Need measurements of the aorta from last CT scan.  OK to leave on vm

## 2014-11-08 NOTE — Telephone Encounter (Signed)
Follow up    Ct scan showed 4.2cm dilation

## 2014-11-08 NOTE — Telephone Encounter (Signed)
Called back stating she got the measurements from the CT.  States she has established with a cardiologist in [place].  She is concerned because she has been having symptoms of tingling in lips and face, problems swallowing.  She has a CT chest done yesterday but thinks she needs a CT of her neck because she is worried that she may have a tear. Is scheduled for Echo next week.  A friend was recently misdiagnosed as having GI problems when she actually had a tear in her "artery in neck". Had a 20 min discussion with her regarding symptoms and test results. She will follow up with her physician there.  She has requested images of her Echo and CT scan be sent to her physician in [place]. Will forward to Dr. Elease Hashimoto as Lorain Childes.

## 2014-11-08 NOTE — Telephone Encounter (Signed)
Lm that Dr. Elease Hashimoto not in office today but will forward message to him and will respond to her when he answers.

## 2015-03-12 IMAGING — CT CT ANGIO CHEST
2 of 5 series · 18 of 36 positions shown · IV contrast (omnipaque)
Comparison: None.

CLINICAL DATA: Evaluate aortic root dilatation. History of mitral
valve prolapse and bicuspid aortic valve. Initial encounter.

EXAM:
CT ANGIOGRAPHY CHEST WITH CONTRAST
TECHNIQUE: Multidetector CT imaging of the chest was performed using the
standard protocol during bolus administration of intravenous
contrast. Multiplanar CT image reconstructions and MIPs were
obtained to evaluate the vascular anatomy.
CONTRAST:  80mL OMNIPAQUE IOHEXOL 350 MG/ML SOLN

[Series 4: cta chest w/cm 3mm · axial · 0.59mm/px · z∈[-276,-21]mm · 17 of 93 slices shown]
[im 4/93  lung]
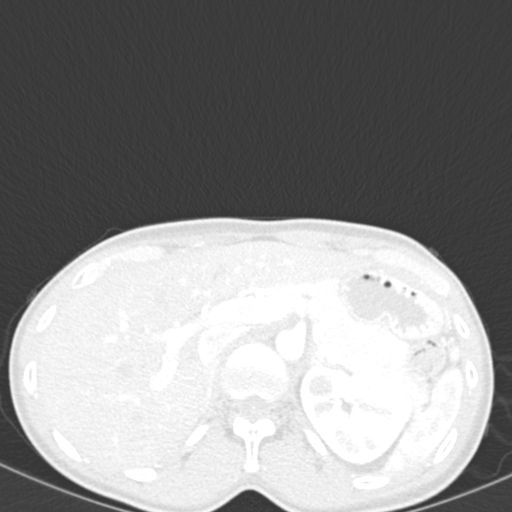
[im 11/93  mediastinal]
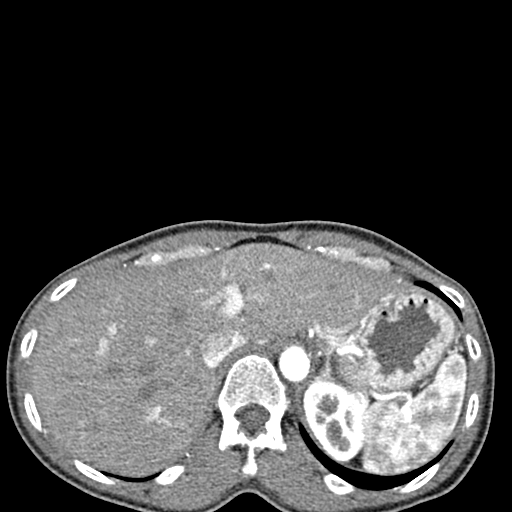
[im 14/93  lung]
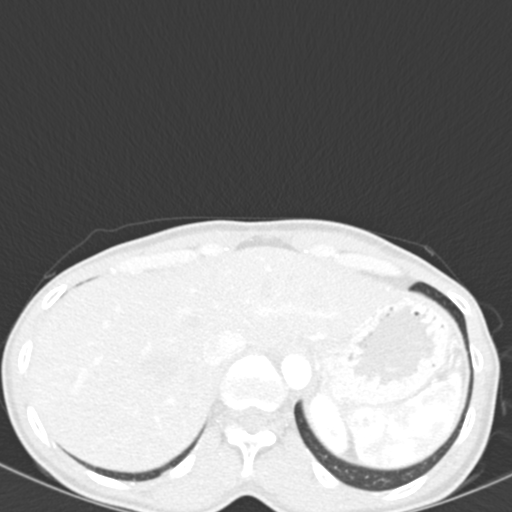
[im 21/93  mediastinal]
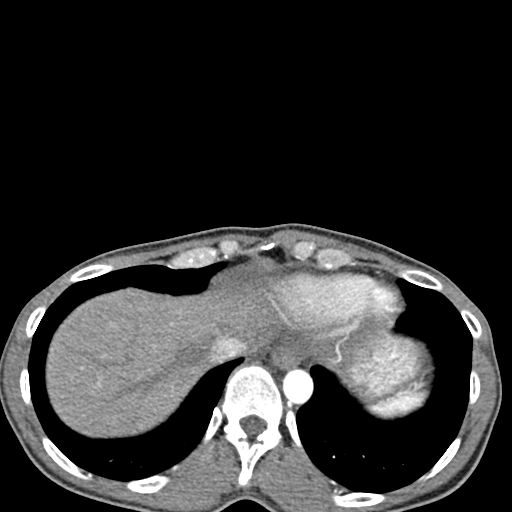
[im 24/93  lung]
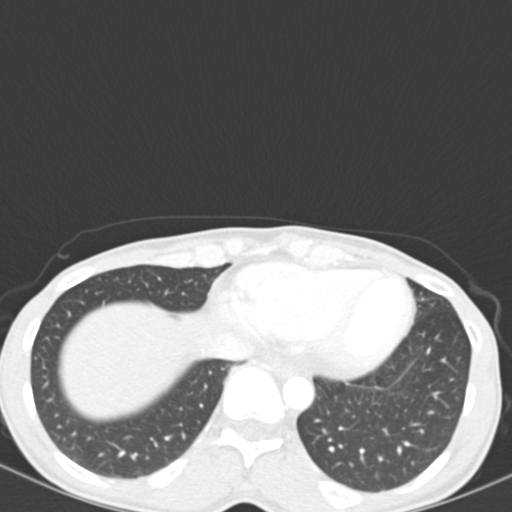
[im 31/93  mediastinal]
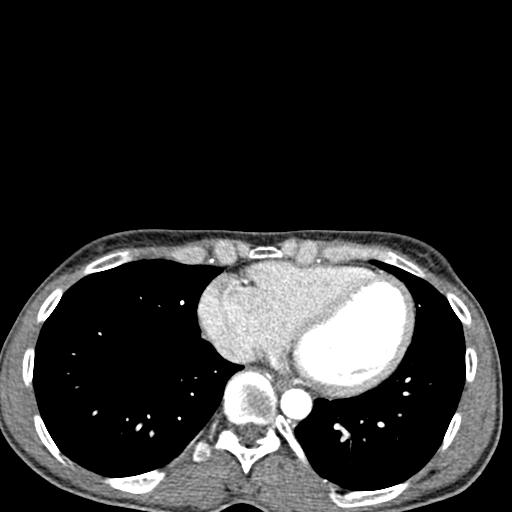
[im 35/93  lung]
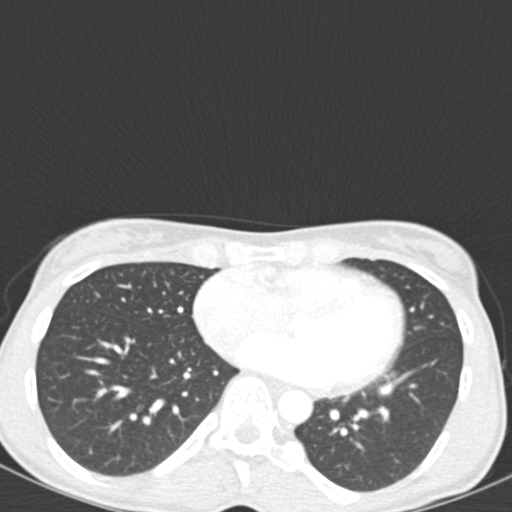
[im 41/93  mediastinal]
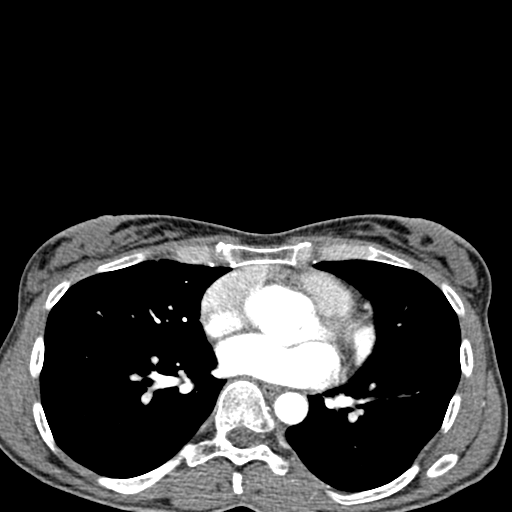
[im 48/93  lung]
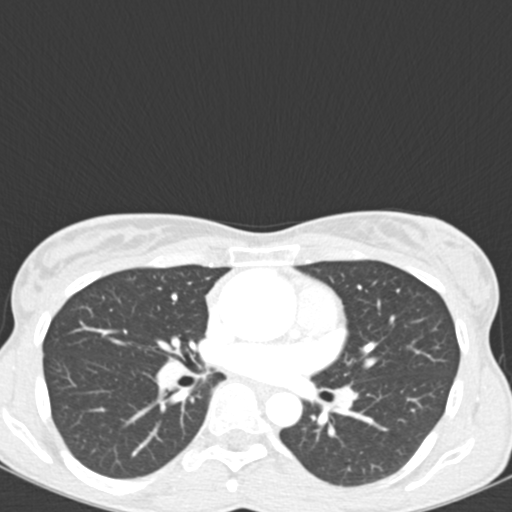
[im 52/93  mediastinal]
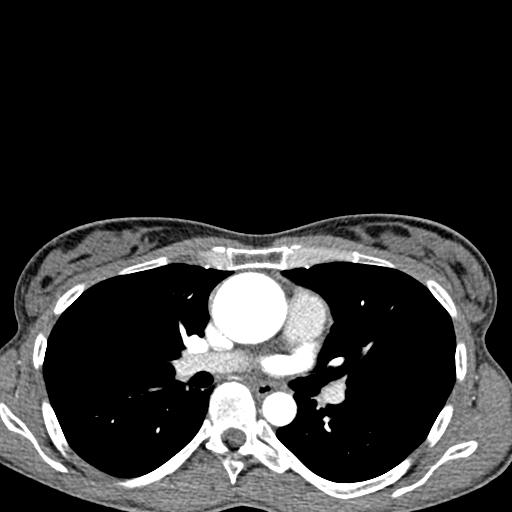
[im 58/93  lung]
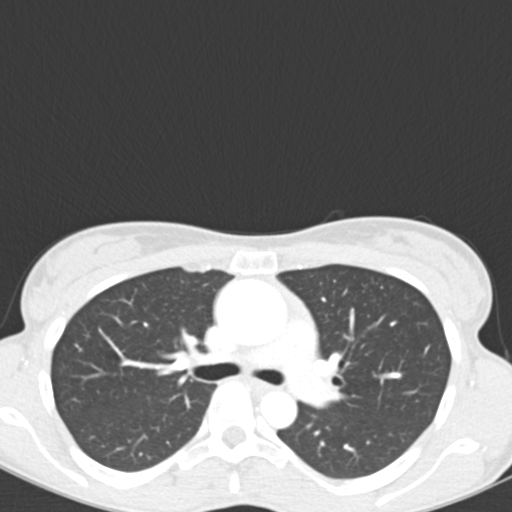
[im 62/93  mediastinal]
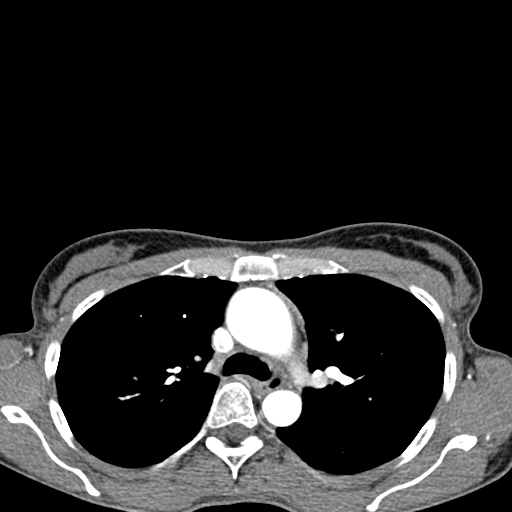
[im 69/93  lung]
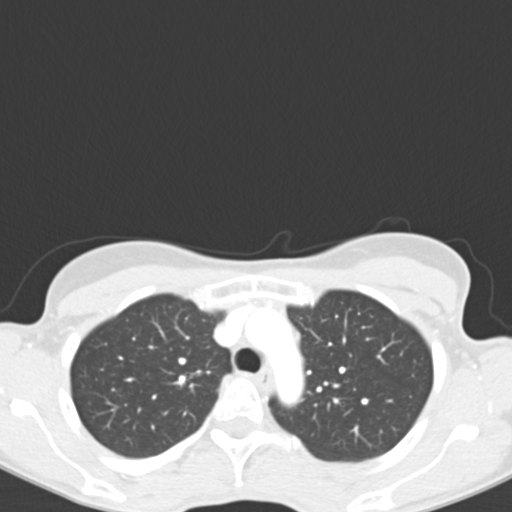
[im 72/93  mediastinal]
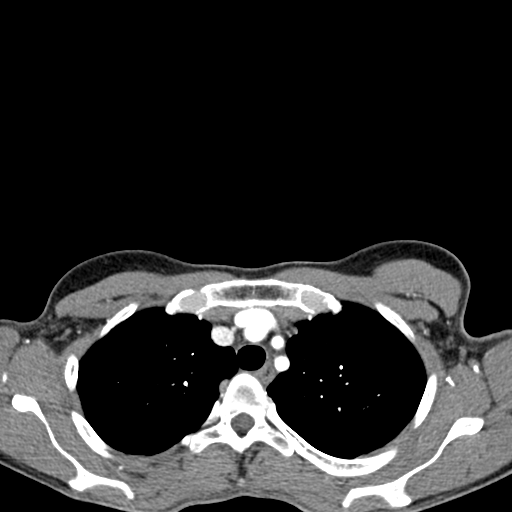
[im 79/93  lung]
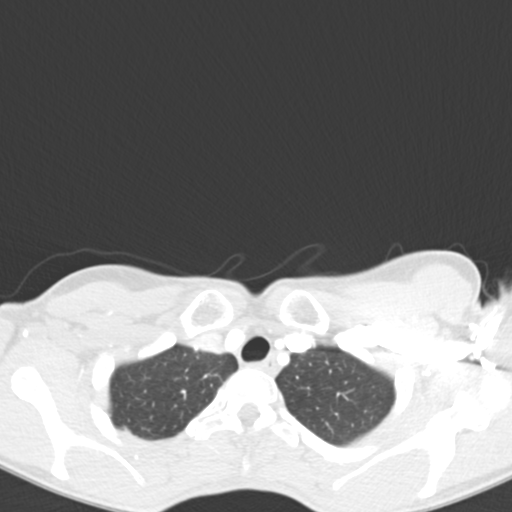
[im 82/93  mediastinal]
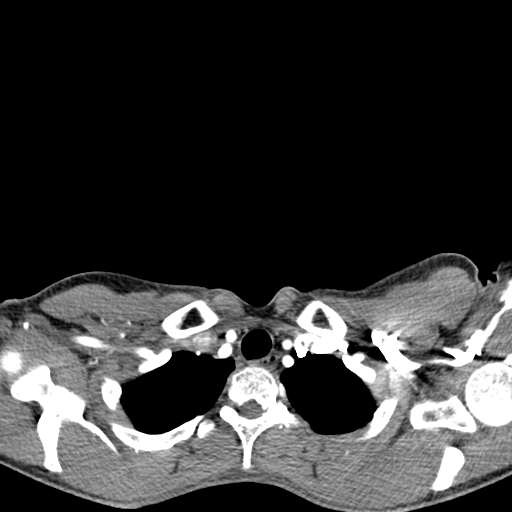
[im 89/93  lung]
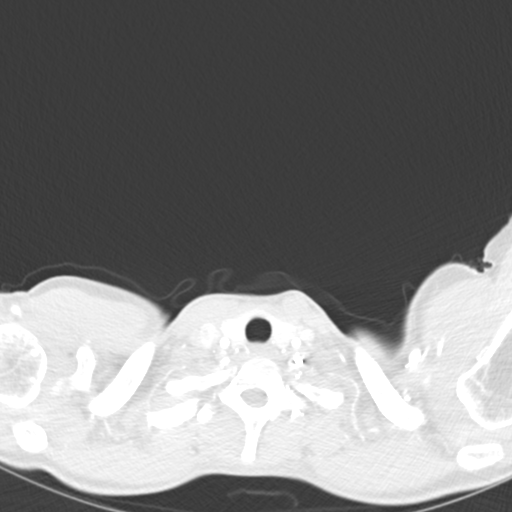

[Series 602: cor mpr · coronal · 0.59mm/px · 1 of 83 slices shown]
[im 42/83  mediastinal]
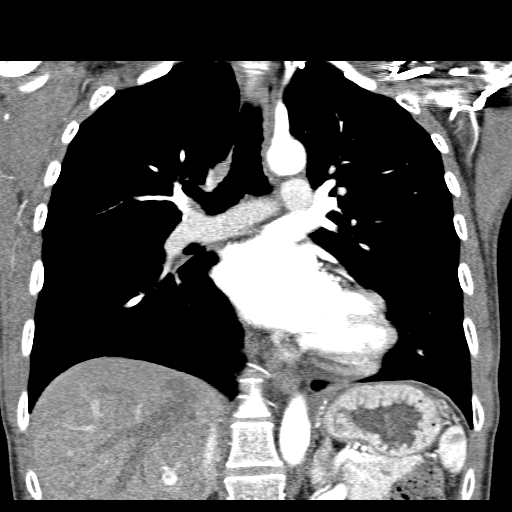

[18 of 36 positions shown; findings below may reference images not displayed]

FINDINGS: Mediastinum/Nodes: No pathologically enlarged mediastinal, hilar or
axillary lymph nodes. Proximal ascending aorta measures up to 4.0 x
4.4 cm (series 602, image 27 and series 4 and image 45). Transverse
and descending thoracic aorta are normal in caliber. Heart size
within normal limits. No pericardial effusion.

Lungs/Pleura: A few scattered subpleural nodular densities measure 4
mm or less in size. Lungs are otherwise clear. No pleural fluid.
Airway is unremarkable.

Upper abdomen: 8 mm low-attenuation lesion in the right hepatic lobe
is too small to characterize but in the absence of known malignancy,
a cyst or hemangioma is most likely. Visualized portions of the
adrenal glands, left kidney, spleen, pancreas, stomach and colon are
grossly unremarkable.

Musculoskeletal: No worrisome lytic or sclerotic lesions.

Review of the MIP images confirms the above findings.
IMPRESSION: 1. Ascending aortic aneurysm. Recommend annual imaging followup by
CTA or MRA. This recommendation follows 2747
ACCF/AHA/AATS/ACR/ASA/SCA/TOKU/MUKUL/SANDIFER/NOSA Guidelines for the
Diagnosis and Management of Patients with Thoracic Aortic Disease.
Circulation. 2747; 121: [id number].
2. Scattered subpleural pulmonary nodular densities are likely
subpleural lymph nodes. If the patient is at high risk for
bronchogenic carcinoma, follow-up chest CT at 1 year is recommended.
If the patient is at low risk, no follow-up is needed. This
recommendation follows the consensus statement: Guidelines for
Management of Small Pulmonary Nodules Detected on CT Scans: A
Statement from the [HOSPITAL] as published in Radiology

## 2015-12-10 ENCOUNTER — Ambulatory Visit: Payer: BLUE CROSS/BLUE SHIELD | Admitting: *Deleted

## 2021-02-04 ENCOUNTER — Encounter: Payer: Self-pay | Admitting: Gastroenterology
